# Patient Record
Sex: Female | Born: 1965 | Race: Black or African American | Hispanic: No | Smoking: Never smoker
Health system: Southern US, Community
[De-identification: ages and names within clinical notes are randomized; demographics above are authoritative.]

## PROBLEM LIST (undated history)

## (undated) DIAGNOSIS — F329 Major depressive disorder, single episode, unspecified: Secondary | ICD-10-CM

## (undated) DIAGNOSIS — F32A Depression, unspecified: Secondary | ICD-10-CM

## (undated) DIAGNOSIS — IMO0001 Reserved for inherently not codable concepts without codable children: Secondary | ICD-10-CM

## (undated) DIAGNOSIS — B20 Human immunodeficiency virus [HIV] disease: Secondary | ICD-10-CM

## (undated) DIAGNOSIS — J45909 Unspecified asthma, uncomplicated: Secondary | ICD-10-CM

## (undated) DIAGNOSIS — Z21 Asymptomatic human immunodeficiency virus [HIV] infection status: Secondary | ICD-10-CM

## (undated) DIAGNOSIS — K219 Gastro-esophageal reflux disease without esophagitis: Secondary | ICD-10-CM

## (undated) HISTORY — PX: ABDOMINAL HYSTERECTOMY: SHX81

---

## 2002-11-12 ENCOUNTER — Other Ambulatory Visit: Payer: Self-pay

## 2004-05-31 ENCOUNTER — Emergency Department: Payer: Self-pay | Admitting: Internal Medicine

## 2006-03-29 ENCOUNTER — Emergency Department: Payer: Self-pay | Admitting: Emergency Medicine

## 2006-03-29 ENCOUNTER — Other Ambulatory Visit: Payer: Self-pay

## 2006-05-20 ENCOUNTER — Emergency Department: Payer: Self-pay | Admitting: Emergency Medicine

## 2006-05-20 ENCOUNTER — Other Ambulatory Visit: Payer: Self-pay

## 2007-10-30 ENCOUNTER — Emergency Department: Payer: Self-pay | Admitting: Emergency Medicine

## 2008-03-21 IMAGING — CT CT CHEST W/ CM
1 series · 16 of 34 positions shown, 20 images · IV contrast (APPLIED)
Comparison: none

REASON FOR EXAM: intermittent cp/tachycardic/hi ddimer
COMMENTS:

[Series 5: soft tissue · axial · 0.77mm/px · z∈[-164,+88]mm · 16 of 95 slices shown, 20 images]
[im 7/95  mediastinal]
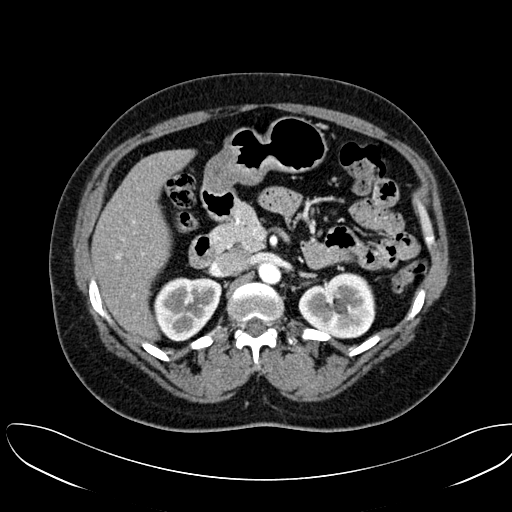
[im 7/95  lung]
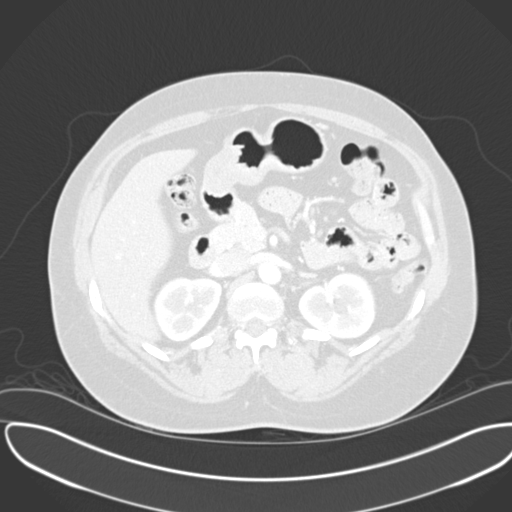
[im 14/95  lung]
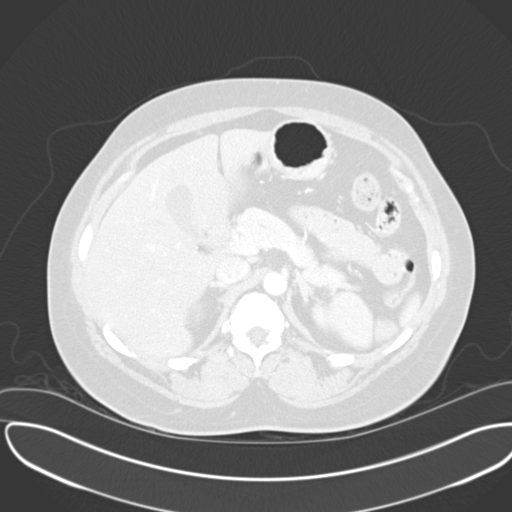
[im 19/95  lung]
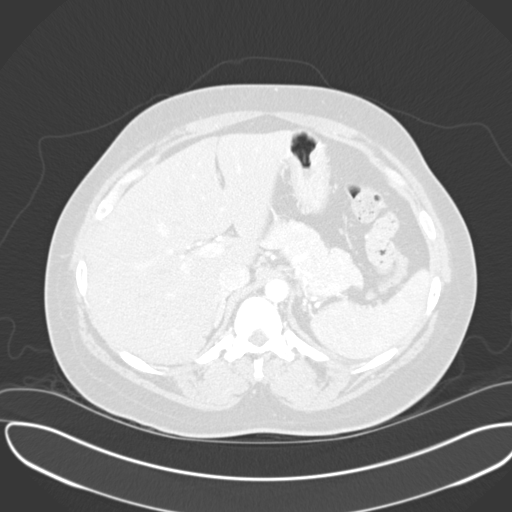
[im 25/95  lung]
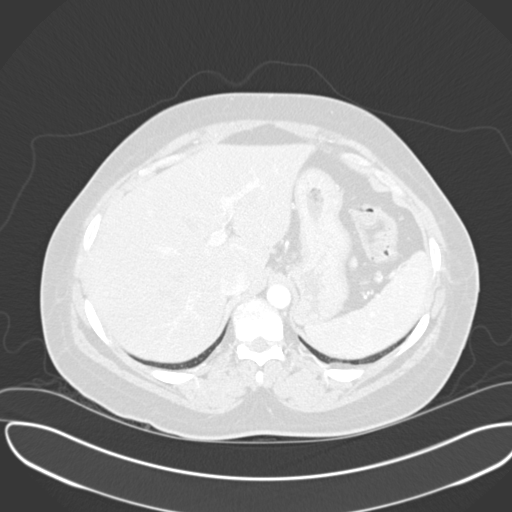
[im 32/95  mediastinal]
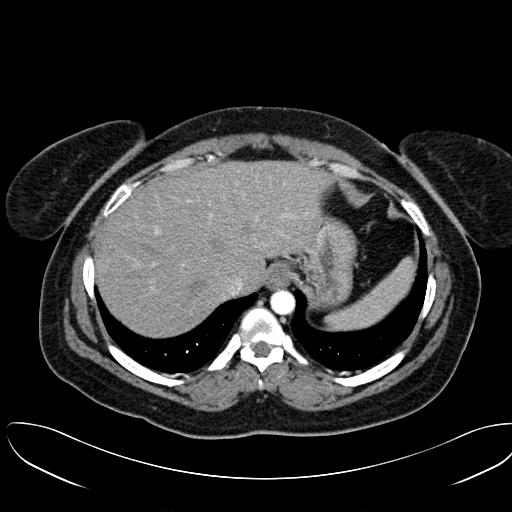
[im 32/95  lung]
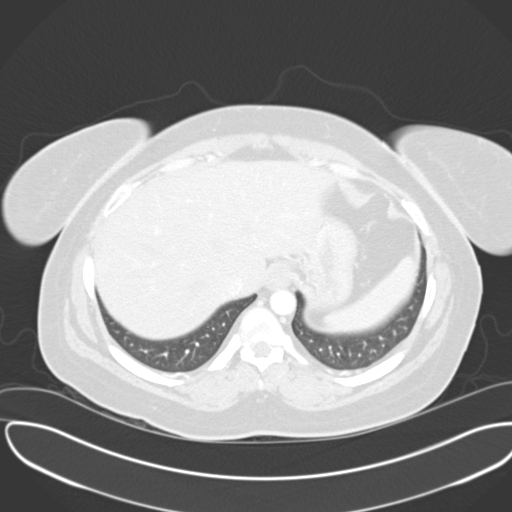
[im 38/95  lung]
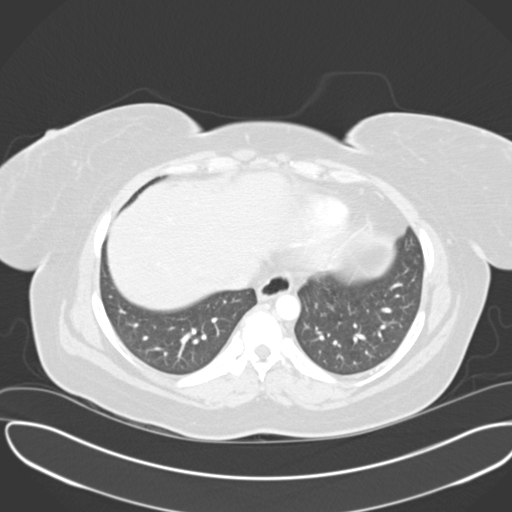
[im 42/95  lung]
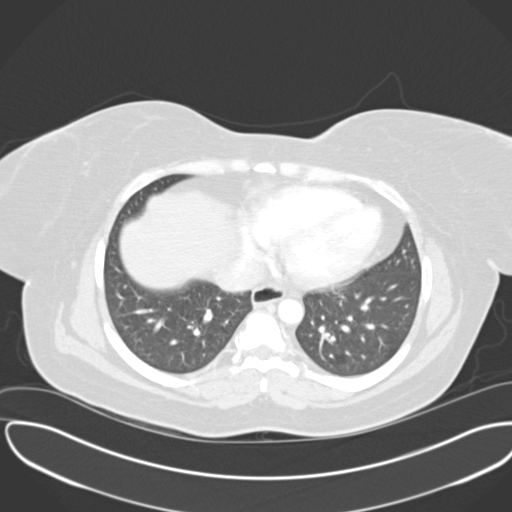
[im 46/95  lung]
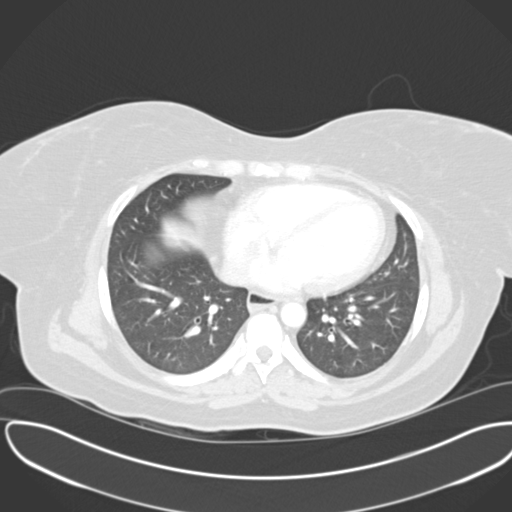
[im 51/95  mediastinal]
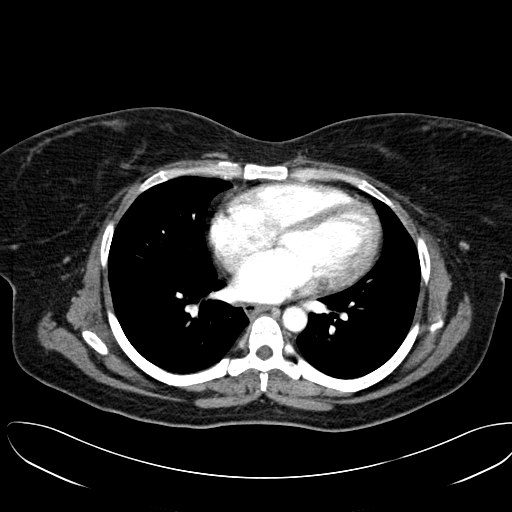
[im 51/95  lung]
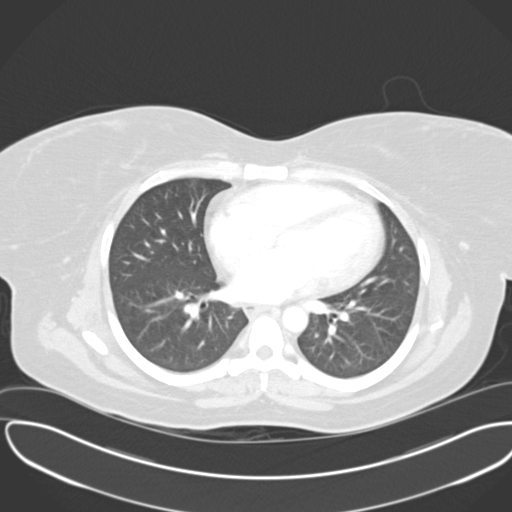
[im 56/95  lung]
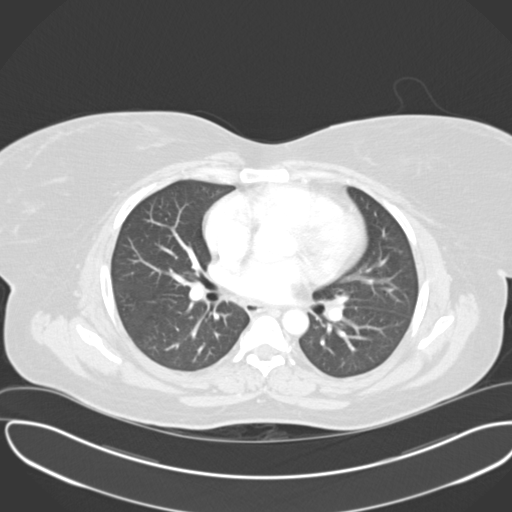
[im 60/95  lung]
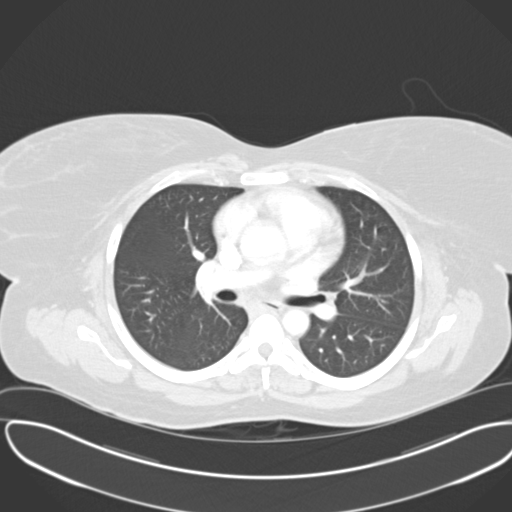
[im 67/95  lung]
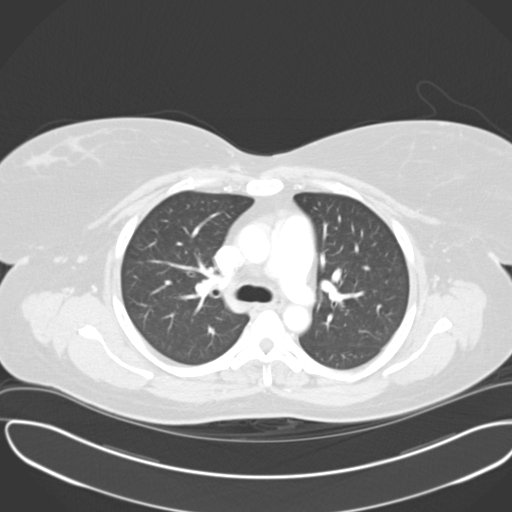
[im 74/95  mediastinal]
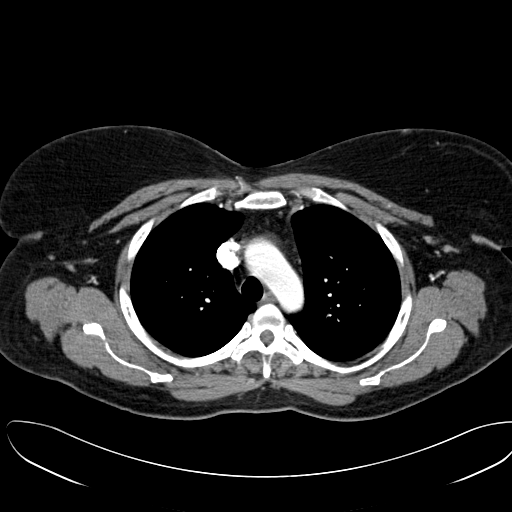
[im 74/95  lung]
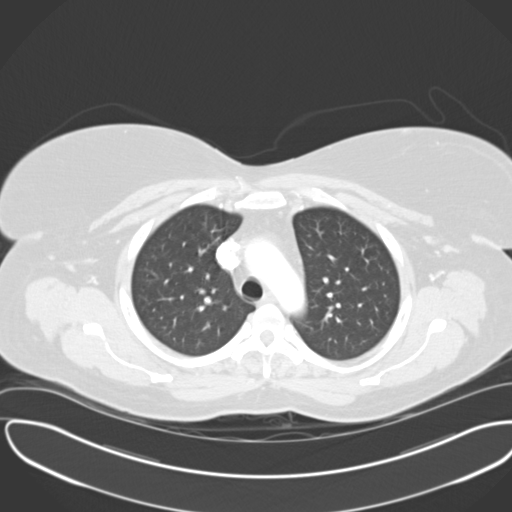
[im 77/95  lung]
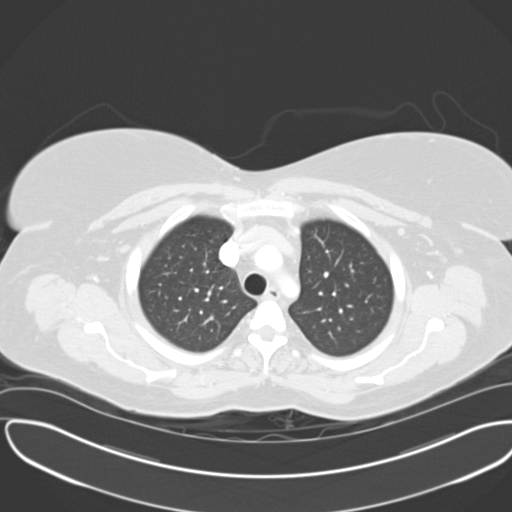
[im 84/95  lung]
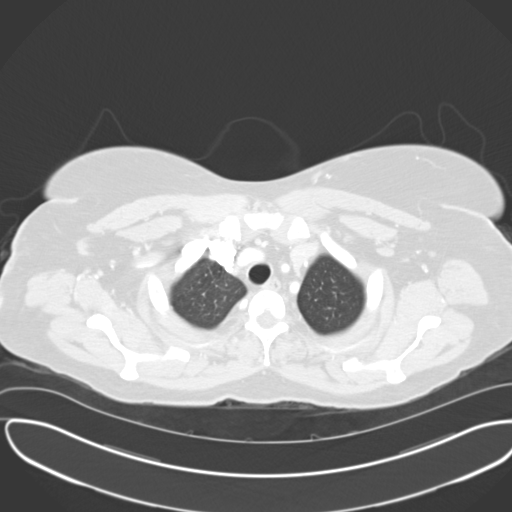
[im 91/95  lung]
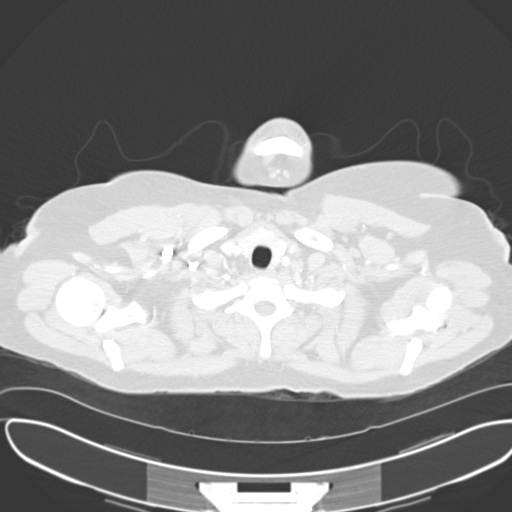

[16 of 34 positions shown; findings below may reference images not displayed]

PROCEDURE:     CT  - CT CHEST WITH CONTRAST  - May 20, 2006  [DATE]

RESULT:     Emergent CT of the chest with contrast demonstrate normal
opacification of the pulmonary arteries. The thoracic aorta is normal. The
upper abdominal viscera included on the study appear normal. The mediastinal
and hilar structures are within normal limits. The lungs are clear. There
does appear to be a small hiatal hernia noted.
IMPRESSION: 1. No pulmonary embolism.
2. Small hiatal hernia.

## 2008-03-21 IMAGING — CR DG CHEST 2V
1 series · 2 of 2 positions shown · non-contrast
Comparison: none

REASON FOR EXAM: sob
COMMENTS:

PROCEDURE:     DXR - DXR CHEST PA (OR AP) AND LATERAL  - May 20, 2006  [DATE]
RESULT:     Comparison is made to a prior exam of 03-29-2006. The lung fields
are clear. The heart, mediastina and osseous structures show no significant
abnormalities.

[Series 1: view not recorded · 0.17mm/px · 2 of 2 slices shown]
[im 1/2]
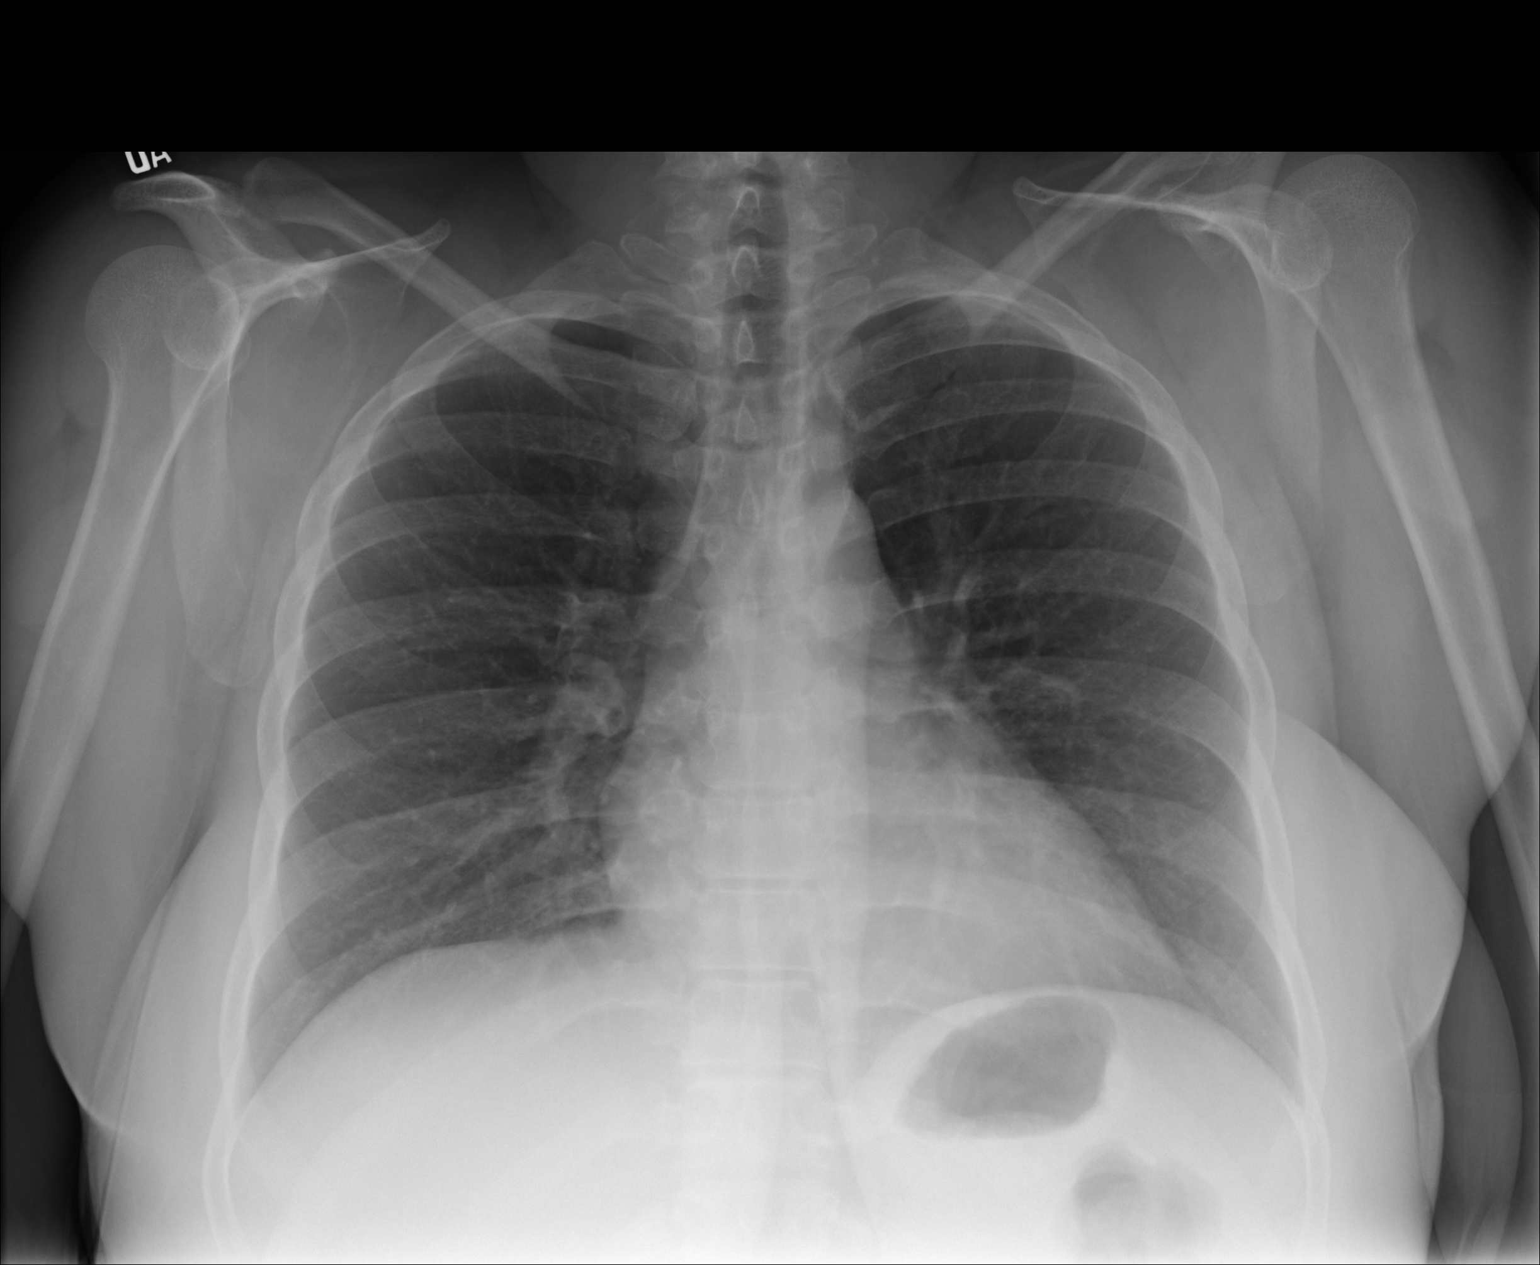
[im 2/2]
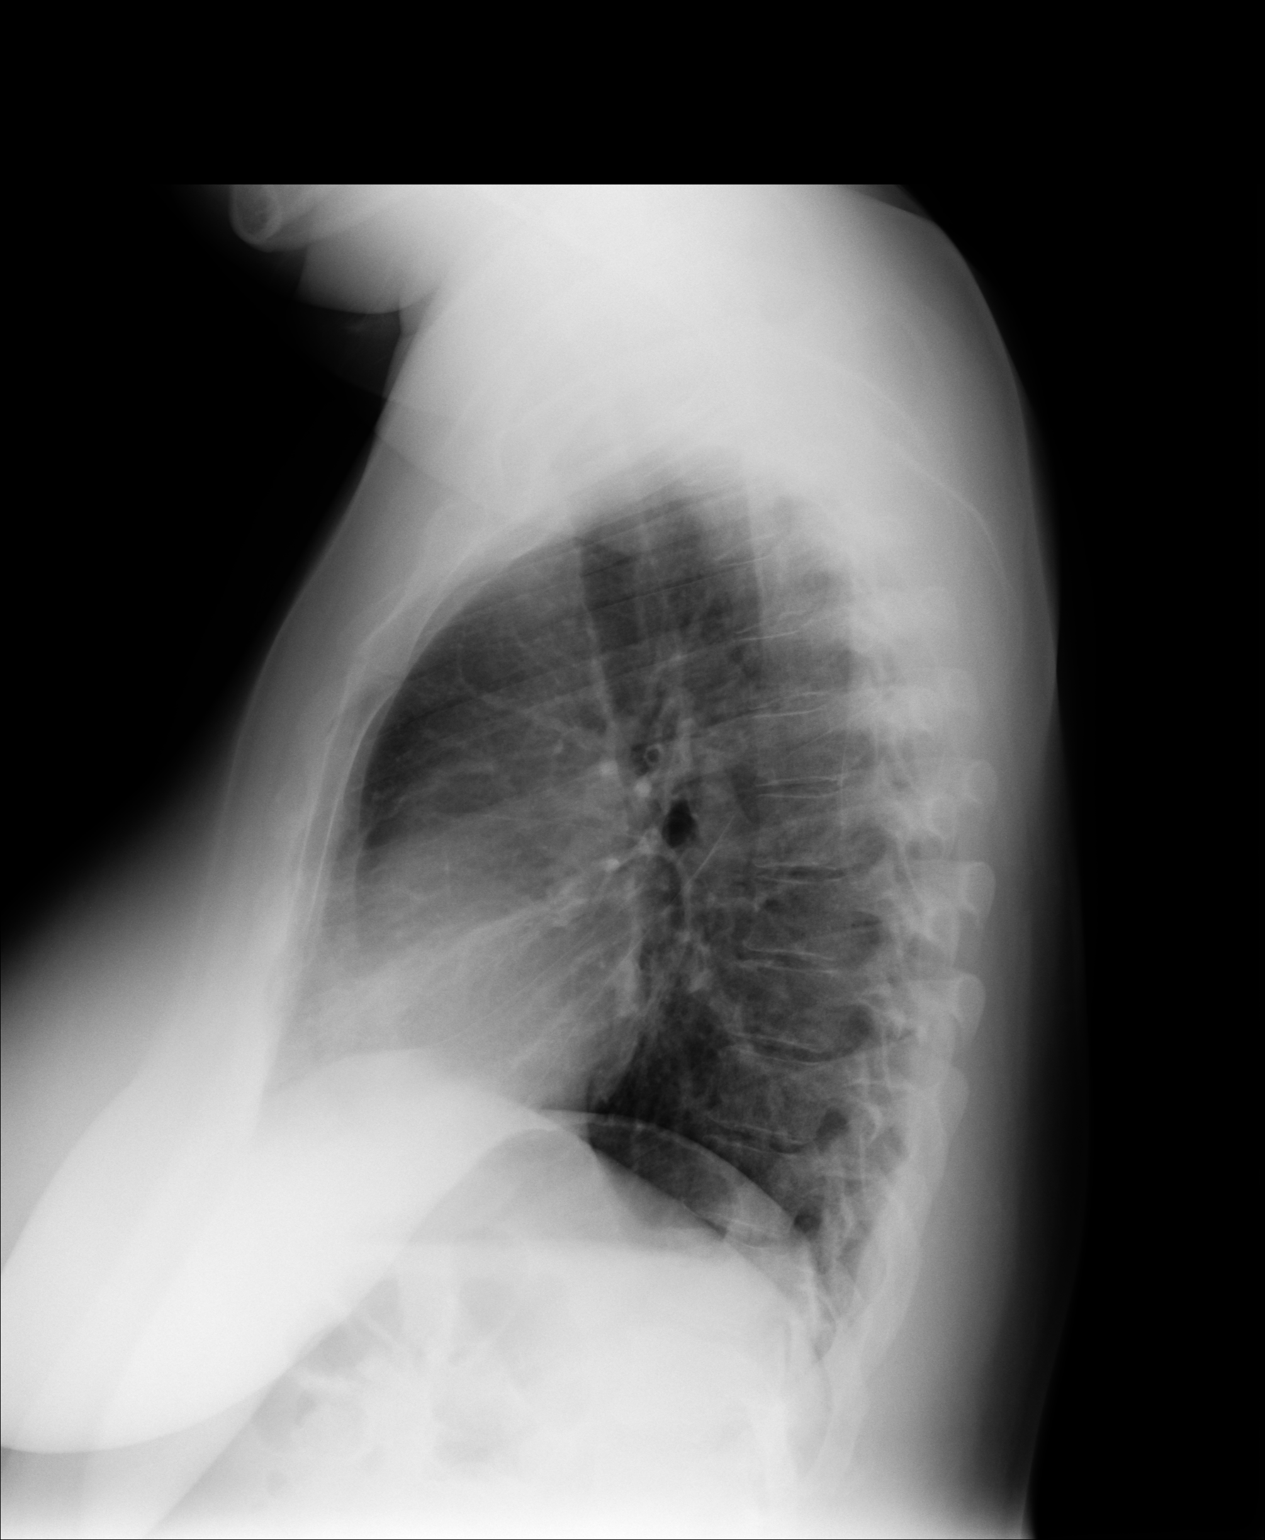

[2 of 2 positions shown; findings below may reference images not displayed]

IMPRESSION: 1.     No acute changes are identified.

## 2010-07-08 ENCOUNTER — Emergency Department: Payer: Self-pay | Admitting: Emergency Medicine

## 2014-08-21 ENCOUNTER — Other Ambulatory Visit: Payer: Self-pay

## 2014-08-21 ENCOUNTER — Emergency Department
Admission: EM | Admit: 2014-08-21 | Discharge: 2014-08-21 | Disposition: A | Discharge status: Home / Self Care | Payer: Medicare Other | Attending: Emergency Medicine | Admitting: Emergency Medicine

## 2014-08-21 ENCOUNTER — Emergency Department: Payer: Medicare Other

## 2014-08-21 DIAGNOSIS — R079 Chest pain, unspecified: Secondary | ICD-10-CM | POA: Diagnosis not present

## 2014-08-21 DIAGNOSIS — Z79899 Other long term (current) drug therapy: Secondary | ICD-10-CM | POA: Insufficient documentation

## 2014-08-21 DIAGNOSIS — Z21 Asymptomatic human immunodeficiency virus [HIV] infection status: Secondary | ICD-10-CM | POA: Insufficient documentation

## 2014-08-21 DIAGNOSIS — R05 Cough: Secondary | ICD-10-CM | POA: Diagnosis present

## 2014-08-21 HISTORY — DX: Asymptomatic human immunodeficiency virus (hiv) infection status: Z21

## 2014-08-21 HISTORY — DX: Human immunodeficiency virus (HIV) disease: B20

## 2014-08-21 LAB — BASIC METABOLIC PANEL
ANION GAP: 10 (ref 5–15)
BUN: 14 mg/dL (ref 6–20)
CHLORIDE: 103 mmol/L (ref 101–111)
CO2: 25 mmol/L (ref 22–32)
Calcium: 9.2 mg/dL (ref 8.9–10.3)
Creatinine, Ser: 1.55 mg/dL — ABNORMAL HIGH (ref 0.44–1.00)
GFR calc Af Amer: 44 mL/min — ABNORMAL LOW (ref >60)
GFR calc non Af Amer: 38 mL/min — ABNORMAL LOW (ref >60)
Glucose, Bld: 107 mg/dL — ABNORMAL HIGH (ref 65–99)
Potassium: 4.1 mmol/L (ref 3.5–5.1)
Sodium: 138 mmol/L (ref 135–145)

## 2014-08-21 LAB — CBC
HEMATOCRIT: 36.9 % (ref 35.0–47.0)
HEMOGLOBIN: 12.5 g/dL (ref 12.0–16.0)
MCH: 36.7 pg — ABNORMAL HIGH (ref 26.0–34.0)
MCHC: 34 g/dL (ref 32.0–36.0)
MCV: 108 fL — ABNORMAL HIGH (ref 80.0–100.0)
Platelets: 323 10*3/uL (ref 150–440)
RBC: 3.42 MIL/uL — ABNORMAL LOW (ref 3.80–5.20)
RDW: 12.5 % (ref 11.5–14.5)
WBC: 9 10*3/uL (ref 3.6–11.0)

## 2014-08-21 LAB — TROPONIN I: Troponin I: <0.03 ng/mL (ref <0.031)

## 2014-08-21 MED ORDER — GI COCKTAIL ~~LOC~~
30.0000 mL | Freq: Once | ORAL | Status: AC
  Administered 2014-08-21: 30 mL via ORAL
  Filled 2014-08-21: qty 30

## 2014-08-21 NOTE — Discharge Instructions (Signed)
No certain cause was found for your 2 weeks of chest discomfort, however your exam and evaluation are reassuring. We discussed the possibility of stress/panic attacks as well as acid reflux/indigestion as possible sources. However given the ongoing chest discomfort, I am referring you to call the cardiologist for an appointment. Call on Monday for an appointment next week. Return to the emergency department for any new or worsening condition including chest pain, nausea, sweating, trouble breathing or shortness of breath, fever, altered mental status, weakness, numbness, or any other symptoms concerning to you.    Chest Pain (Nonspecific) It is often hard to give a specific diagnosis for the cause of chest pain. There is always a chance that your pain could be related to something serious, such as a heart attack or a blood clot in the lungs. You need to follow up with your health care provider for further evaluation. CAUSES   Heartburn.  Pneumonia or bronchitis.  Anxiety or stress.  Inflammation around your heart (pericarditis) or lung (pleuritis or pleurisy).  A blood clot in the lung.  A collapsed lung (pneumothorax). It can develop suddenly on its own (spontaneous pneumothorax) or from trauma to the chest.  Shingles infection (herpes zoster virus). The chest wall is composed of bones, muscles, and cartilage. Any of these can be the source of the pain.  The bones can be bruised by injury.  The muscles or cartilage can be strained by coughing or overwork.  The cartilage can be affected by inflammation and become sore (costochondritis). DIAGNOSIS  Lab tests or other studies may be needed to find the cause of your pain. Your health care provider may have you take a test called an ambulatory electrocardiogram (ECG). An ECG records your heartbeat patterns over a 24-hour period. You may also have other tests, such as:  Transthoracic echocardiogram (TTE). During echocardiography, sound  waves are used to evaluate how blood flows through your heart.  Transesophageal echocardiogram (TEE).  Cardiac monitoring. This allows your health care provider to monitor your heart rate and rhythm in real time.  Holter monitor. This is a portable device that records your heartbeat and can help diagnose heart arrhythmias. It allows your health care provider to track your heart activity for several days, if needed.  Stress tests by exercise or by giving medicine that makes the heart beat faster. TREATMENT   Treatment depends on what may be causing your chest pain. Treatment may include:  Acid blockers for heartburn.  Anti-inflammatory medicine.  Pain medicine for inflammatory conditions.  Antibiotics if an infection is present.  You may be advised to change lifestyle habits. This includes stopping smoking and avoiding alcohol, caffeine, and chocolate.  You may be advised to keep your head raised (elevated) when sleeping. This reduces the chance of acid going backward from your stomach into your esophagus. Most of the time, nonspecific chest pain will improve within 2-3 days with rest and mild pain medicine.  HOME CARE INSTRUCTIONS   If antibiotics were prescribed, take them as directed. Finish them even if you start to feel better.  For the next few days, avoid physical activities that bring on chest pain. Continue physical activities as directed.  Do not use any tobacco products, including cigarettes, chewing tobacco, or electronic cigarettes.  Avoid drinking alcohol.  Only take medicine as directed by your health care provider.  Follow your health care provider's suggestions for further testing if your chest pain does not go away.  Keep any follow-up appointments you made.  If you do not go to an appointment, you could develop lasting (chronic) problems with pain. If there is any problem keeping an appointment, call to reschedule. SEEK MEDICAL CARE IF:   Your chest pain  does not go away, even after treatment.  You have a rash with blisters on your chest.  You have a fever. SEEK IMMEDIATE MEDICAL CARE IF:   You have increased chest pain or pain that spreads to your arm, neck, jaw, back, or abdomen.  You have shortness of breath.  You have an increasing cough, or you cough up blood.  You have severe back or abdominal pain.  You feel nauseous or vomit.  You have severe weakness.  You faint.  You have chills. This is an emergency. Do not wait to see if the pain will go away. Get medical help at once. Call your local emergency services (911 in [place] Do not drive yourself to the hospital. MAKE SURE YOU:   Understand these instructions.  Will watch your condition.  Will get help right away if you are not doing well or get worse. Document Released: 10/05/2004 Document Revised: 12/31/2012 Document Reviewed: 08/01/2007 [facility] Patient Information 2015 [place], [place]. This information is not intended to replace advice given to you by your health care provider. Make sure you discuss any questions you have with your health care provider.

## 2014-08-21 NOTE — ED Provider Notes (Signed)
[facility] Emergency Department Provider Note   ____________________________________________  Time seen: 5 PM I have reviewed the triage vital signs and the triage nursing note.  HISTORY  Chief Complaint Chest Pain and Cough   Historian Patient  HPI Caitlin Mcbride is a 49 y.o. female who has a history of HIV, panic and anxiety, who is here for 2 weeks of what she describes as constant chest tightness. She is describing this as chest pain. It is not pleuritic. Mild shortness of breath but no wheezing. No history of use of inhalers. She states she is referred to see a doctor at [facility] for pulmonary function testing next week. Reports her viral load are undetectable and compliance with HIV medications. No fevers. No weakness or numbness. She does have acid reflux indigestion and takes omeprazole for this.    Past Medical History  Diagnosis Date  . HIV (human immunodeficiency virus infection)     There are no active problems to display for this patient.   Past Surgical History  Procedure Laterality Date  . Abdominal hysterectomy      Current Outpatient Rx  Name  Route  Sig  Dispense  Refill  . citalopram (CELEXA) 10 MG tablet   Oral   Take 10 mg by mouth daily.         . darunavir (PREZISTA) 600 MG tablet   Oral   Take 600 mg by mouth 2 (two) times daily.         Marland Kitchen emtricitabine-tenofovir AF (DESCOVY) 200-25 MG per tablet   Oral   Take 1 tablet by mouth daily.         Marland Kitchen gabapentin (NEURONTIN) 300 MG capsule   Oral   Take 300 mg by mouth at bedtime.         Marland Kitchen LORazepam (ATIVAN) 1 MG tablet   Oral   Take 1 mg by mouth every 8 (eight) hours as needed for anxiety.         Marland Kitchen omeprazole (PRILOSEC) 20 MG capsule   Oral   Take 20 mg by mouth daily.         . raltegravir (ISENTRESS) 400 MG tablet   Oral   Take 400 mg by mouth 2 (two) times daily.         . risperiDONE (RISPERDAL) 0.5 MG tablet   Oral   Take 0.5 mg by mouth 2  (two) times daily.         . ritonavir (NORVIR) 100 MG TABS tablet   Oral   Take 100 mg by mouth 2 (two) times daily.         . traZODone (DESYREL) 100 MG tablet   Oral   Take 100 mg by mouth at bedtime.         . zidovudine (RETROVIR) 300 MG tablet   Oral   Take 300 mg by mouth 2 (two) times daily.           Allergies Review of patient's allergies indicates no known allergies.  No family history on file.  Social History Social History  Substance Use Topics  . Smoking status: Never Smoker   . Smokeless tobacco: Never Used  . Alcohol Use: No    Review of Systems  Constitutional: Negative for fever. Eyes: Negative for visual changes. ENT: Negative for sore throat. Cardiovascular: Negative for palpitations Respiratory: mild cough. Gastrointestinal: Negative for abdominal pain, vomiting and diarrhea. Genitourinary: Negative for dysuria. Musculoskeletal: Negative for back pain. Skin: Negative for  rash. Neurological: Negative for headaches, focal weakness or numbness. 10 point Review of Systems otherwise negative ____________________________________________   PHYSICAL EXAM:  VITAL SIGNS: ED Triage Vitals  Enc Vitals Group     BP 08/21/14 1304 144/90 mmHg     Pulse Rate 08/21/14 1304 101     Resp 08/21/14 1304 18     Temp 08/21/14 1304 98 F (36.7 C)     Temp Source 08/21/14 1304 Oral     SpO2 08/21/14 1304 97 %     Weight 08/21/14 1300 198 lb (89.812 kg)     Height 08/21/14 1300  (1.6 m)     Head Cir --      Peak Flow --      Pain Score 08/21/14 1301 7     Pain Loc --      Pain Edu? --      Excl. in GC? --      Constitutional: Alert and oriented. Well appearing and in no distress. Eyes: Conjunctivae are normal. PERRL. Normal extraocular movements. ENT   Head: Normocephalic and atraumatic.   Nose: No congestion/rhinnorhea.   Mouth/Throat: Mucous membranes are moist.   Neck: No stridor. Cardiovascular/Chest: Normal rate,  regular rhythm.  No murmurs, rubs, or gallops. Respiratory: Normal respiratory effort without tachypnea nor retractions. Breath sounds are clear and equal bilaterally. No wheezes/rales/rhonchi. Gastrointestinal: Soft. No distention, no guarding, no rebound. Nontender . Obese  Genitourinary/rectal:Deferred Musculoskeletal: Nontender with normal range of motion in all extremities. No joint effusions.  No lower extremity tenderness nor edema. Neurologic:  Normal speech and language. No gross or focal neurologic deficits are appreciated. Skin:  Skin is warm, dry and intact. No rash noted. Psychiatric: Mood and affect are normal. Speech and behavior are normal. Patient exhibits appropriate insight and judgment.  ____________________________________________   EKG I, Governor Rooks, MD, the attending physician have personally viewed and interpreted all ECGs.  101 bpm. Sinus tachycardia. Narrow QRS. Normal axis. Nonspecific T wave. ____________________________________________  LABS (pertinent positives/negatives)  Basic metabolic panel without significant abnormality CBC shows white blood cell count 9.0, hemoglobin 12.5 and platelets 323 Troponin less than 0.03  ____________________________________________  RADIOLOGY All Xrays were viewed by me. Imaging interpreted by Radiologist.  Chest x-ray two-view: No active cardio pulmonary disease __________________________________________  PROCEDURES  Procedure(s) performed: None Critical Care performed: None  ____________________________________________   ED COURSE / ASSESSMENT AND PLAN  CONSULTATIONS: None  Pertinent labs & imaging results that were available during my care of the patient were reviewed by me and considered in my medical decision making (see chart for details).   Patient is overall well-appearing and her vital signs and laboratory evaluation are reassuring for no emergency medical condition. I'm not exactly sure what  what her ongoing chest tightness and discomfort are coming from, however she thinks it might be coming from her stress and anxiety. She does see a therapist and she also sees Dr. Janeece Riggers in [place]. She's got call and make an appointment for follow-up to discuss management of her anxiety. Patient does not have any acute psychiatric need for emergencypsychiatric evaluation.some of her symptoms, they may be related to acid reflux indigestion, she is already on omeprazole. We discussed dietary changes to help with indigestion.  Chest x-ray clear, lungs clear without wheezing, and O2 sat normal. No pleuritic chest pain. I do not suspect a PE. No evidence of pneumonia. I think ACS is unlikely given her age and the description of the pain, with a reassuring EKG and  negative troponin. I will go ahead and refer her to follow-up with a cardiologist as well.  In terms of her HIV, she is reporting compliance and undetectable viral load, so I'm not worried about any additional risk factors for this patient for PCP pneumonia or some other HIV-related disease.  Patient / Family / Caregiver informed of clinical course, medical decision-making process, and agree with plan.   I discussed return precautions, follow-up instructions, and discharged instructions with patient and/or family.  ___________________________________________   FINAL CLINICAL IMPRESSION(S) / ED DIAGNOSES   Final diagnoses:  Chest pain, unspecified chest pain type    FOLLOW UP  Referred ZO:XWRUEAV care physician, psychiatrist, and cardiologist-Dr. Lady Gary next week   Governor Rooks, MD 08/21/14 1729

## 2014-08-21 NOTE — ED Notes (Signed)
Pt c/o chest tightness with a cough for the past week.  Denies SOB or other sx.Marland Kitchen

## 2014-08-24 ENCOUNTER — Encounter: Payer: Self-pay | Admitting: Emergency Medicine

## 2014-08-24 ENCOUNTER — Emergency Department: Payer: Medicare Other

## 2014-08-24 ENCOUNTER — Emergency Department
Admission: EM | Admit: 2014-08-24 | Discharge: 2014-08-24 | Disposition: A | Discharge status: Home / Self Care | Payer: Medicare Other | Attending: Emergency Medicine | Admitting: Emergency Medicine

## 2014-08-24 ENCOUNTER — Other Ambulatory Visit: Payer: Self-pay

## 2014-08-24 DIAGNOSIS — IMO0001 Reserved for inherently not codable concepts without codable children: Secondary | ICD-10-CM

## 2014-08-24 DIAGNOSIS — Z79899 Other long term (current) drug therapy: Secondary | ICD-10-CM | POA: Diagnosis not present

## 2014-08-24 DIAGNOSIS — R079 Chest pain, unspecified: Secondary | ICD-10-CM | POA: Diagnosis present

## 2014-08-24 DIAGNOSIS — K219 Gastro-esophageal reflux disease without esophagitis: Secondary | ICD-10-CM | POA: Diagnosis not present

## 2014-08-24 HISTORY — DX: Major depressive disorder, single episode, unspecified: F32.9

## 2014-08-24 HISTORY — DX: Reserved for inherently not codable concepts without codable children: IMO0001

## 2014-08-24 HISTORY — DX: Gastro-esophageal reflux disease without esophagitis: K21.9

## 2014-08-24 HISTORY — DX: Depression, unspecified: F32.A

## 2014-08-24 LAB — BASIC METABOLIC PANEL
Anion gap: 7 (ref 5–15)
BUN: 17 mg/dL (ref 6–20)
CHLORIDE: 102 mmol/L (ref 101–111)
CO2: 27 mmol/L (ref 22–32)
Calcium: 9 mg/dL (ref 8.9–10.3)
Creatinine, Ser: 1.38 mg/dL — ABNORMAL HIGH (ref 0.44–1.00)
GFR calc non Af Amer: 44 mL/min — ABNORMAL LOW (ref >60)
GFR, EST AFRICAN AMERICAN: 51 mL/min — AB (ref >60)
Glucose, Bld: 92 mg/dL (ref 65–99)
Potassium: 4.2 mmol/L (ref 3.5–5.1)
Sodium: 136 mmol/L (ref 135–145)

## 2014-08-24 LAB — CBC
HCT: 37.5 % (ref 35.0–47.0)
HEMOGLOBIN: 12.4 g/dL (ref 12.0–16.0)
MCH: 36.1 pg — AB (ref 26.0–34.0)
MCHC: 32.9 g/dL (ref 32.0–36.0)
MCV: 109.7 fL — AB (ref 80.0–100.0)
Platelets: 307 10*3/uL (ref 150–440)
RBC: 3.42 MIL/uL — ABNORMAL LOW (ref 3.80–5.20)
RDW: 12.9 % (ref 11.5–14.5)
WBC: 7.8 10*3/uL (ref 3.6–11.0)

## 2014-08-24 LAB — TROPONIN I: Troponin I: <0.03 ng/mL (ref <0.031)

## 2014-08-24 MED ORDER — GI COCKTAIL ~~LOC~~
ORAL | Status: AC
  Filled 2014-08-24: qty 30

## 2014-08-24 MED ORDER — ALUM & MAG HYDROXIDE-SIMETH 400-400-40 MG/5ML PO SUSP: 5.0000 mL | Freq: Four times a day (QID) | ORAL | Status: AC | PRN

## 2014-08-24 MED ORDER — GI COCKTAIL ~~LOC~~
30.0000 mL | Freq: Once | ORAL | Status: AC
  Administered 2014-08-24: 30 mL via ORAL

## 2014-08-24 NOTE — ED Notes (Signed)
Pt with 2 weeks of centralized chest pain, reports she was checked out for the same and sent home with rx for maalox. Pt in no apparent distress.

## 2014-08-24 NOTE — ED Notes (Signed)
Pt told this RN that she was seen here last week for the same type of pain, was given Maalox with relief, and was told to stay away from "red foods." Pt states she ate a red hot dog for lunch and began feeling the same pain as last week. Was taken back for an EKG within .

## 2014-08-24 NOTE — Discharge Instructions (Signed)
Chest Pain (Nonspecific) °It is often hard to give a specific diagnosis for the cause of chest pain. There is always a chance that your pain could be related to something serious, such as a heart attack or a blood clot in the lungs. You need to follow up with your health care provider for further evaluation. °CAUSES  °· Heartburn. °· Pneumonia or bronchitis. °· Anxiety or stress. °· Inflammation around your heart (pericarditis) or lung (pleuritis or pleurisy). °· A blood clot in the lung. °· A collapsed lung (pneumothorax). It can develop suddenly on its own (spontaneous pneumothorax) or from trauma to the chest. °· Shingles infection (herpes zoster virus). °The chest wall is composed of bones, muscles, and cartilage. Any of these can be the source of the pain. °· The bones can be bruised by injury. °· The muscles or cartilage can be strained by coughing or overwork. °· The cartilage can be affected by inflammation and become sore (costochondritis). °DIAGNOSIS  °Lab tests or other studies may be needed to find the cause of your pain. Your health care provider may have you take a test called an ambulatory electrocardiogram (ECG). An ECG records your heartbeat patterns over a 24-hour period. You may also have other tests, such as: °· Transthoracic echocardiogram (TTE). During echocardiography, sound waves are used to evaluate how blood flows through your heart. °· Transesophageal echocardiogram (TEE). °· Cardiac monitoring. This allows your health care provider to monitor your heart rate and rhythm in real time. °· Holter monitor. This is a portable device that records your heartbeat and can help diagnose heart arrhythmias. It allows your health care provider to track your heart activity for several days, if needed. °· Stress tests by exercise or by giving medicine that makes the heart beat faster. °TREATMENT  °· Treatment depends on what may be causing your chest pain. Treatment may include: °· Acid blockers for  heartburn. °· Anti-inflammatory medicine. °· Pain medicine for inflammatory conditions. °· Antibiotics if an infection is present. °· You may be advised to change lifestyle habits. This includes stopping smoking and avoiding alcohol, caffeine, and chocolate. °· You may be advised to keep your head raised (elevated) when sleeping. This reduces the chance of acid going backward from your stomach into your esophagus. °Most of the time, nonspecific chest pain will improve within 2-3 days with rest and mild pain medicine.  °HOME CARE INSTRUCTIONS  °· If antibiotics were prescribed, take them as directed. Finish them even if you start to feel better. °· For the next few days, avoid physical activities that bring on chest pain. Continue physical activities as directed. °· Do not use any tobacco products, including cigarettes, chewing tobacco, or electronic cigarettes. °· Avoid drinking alcohol. °· Only take medicine as directed by your health care provider. °· Follow your health care provider's suggestions for further testing if your chest pain does not go away. °· Keep any follow-up appointments you made. If you do not go to an appointment, you could develop lasting (chronic) problems with pain. If there is any problem keeping an appointment, call to reschedule. °SEEK MEDICAL CARE IF:  °· Your chest pain does not go away, even after treatment. °· You have a rash with blisters on your chest. °· You have a fever. °SEEK IMMEDIATE MEDICAL CARE IF:  °· You have increased chest pain or pain that spreads to your arm, neck, jaw, back, or abdomen. °· You have shortness of breath. °· You have an increasing cough, or you cough   up blood. °· You have severe back or abdominal pain. °· You feel nauseous or vomit. °· You have severe weakness. °· You faint. °· You have chills. °This is an emergency. Do not wait to see if the pain will go away. Get medical help at once. Call your local emergency services (911 in [place] Do not drive  yourself to the hospital. °MAKE SURE YOU:  °· Understand these instructions. °· Will watch your condition. °· Will get help right away if you are not doing well or get worse. °Document Released: 10/05/2004 Document Revised: 12/31/2012 Document Reviewed: 08/01/2007 °[employer]® Patient Information ©2015 [employer]. This information is not intended to replace advice given to you by your health care provider. Make sure you discuss any questions you have with your health care provider. °Gastroesophageal Reflux Disease, Adult °Gastroesophageal reflux disease (GERD) happens when acid from your stomach flows up into the esophagus. When acid comes in contact with the esophagus, the acid causes soreness (inflammation) in the esophagus. Over time, GERD may create small holes (ulcers) in the lining of the esophagus. °CAUSES  °· Increased body weight. This puts pressure on the stomach, making acid rise from the stomach into the esophagus. °· Smoking. This increases acid production in the stomach. °· Drinking alcohol. This causes decreased pressure in the lower esophageal sphincter (valve or ring of muscle between the esophagus and stomach), allowing acid from the stomach into the esophagus. °· Late evening meals and a full stomach. This increases pressure and acid production in the stomach. °· A malformed lower esophageal sphincter. °Sometimes, no cause is found. °SYMPTOMS  °· Burning pain in the lower part of the mid-chest behind the breastbone and in the mid-stomach area. This may occur twice a week or more often. °· Trouble swallowing. °· Sore throat. °· Dry cough. °· Asthma-like symptoms including chest tightness, shortness of breath, or wheezing. °DIAGNOSIS  °Your caregiver may be able to diagnose GERD based on your symptoms. In some cases, X-rays and other tests may be done to check for complications or to check the condition of your stomach and esophagus. °TREATMENT  °Your caregiver may recommend over-the-counter or  prescription medicines to help decrease acid production. Ask your caregiver before starting or adding any new medicines.  °HOME CARE INSTRUCTIONS  °· Change the factors that you can control. Ask your caregiver for guidance concerning weight loss, quitting smoking, and alcohol consumption. °· Avoid foods and drinks that make your symptoms worse, such as: °¨ Caffeine or alcoholic drinks. °¨ Chocolate. °¨ Peppermint or mint flavorings. °¨ Garlic and onions. °¨ Spicy foods. °¨ Citrus fruits, such as oranges, lemons, or limes. °¨ Tomato-based foods such as sauce, chili, salsa, and pizza. °¨ Fried and fatty foods. °· Avoid lying down for the 3 hours prior to your bedtime or prior to taking a nap. °· Eat small, frequent meals instead of large meals. °· Wear loose-fitting clothing. Do not wear anything tight around your waist that causes pressure on your stomach. °· Raise the head of your bed 6 to 8 inches with wood blocks to help you sleep. Extra pillows will not help. °· Only take over-the-counter or prescription medicines for pain, discomfort, or fever as directed by your caregiver. °· Do not take aspirin, ibuprofen, or other nonsteroidal anti-inflammatory drugs (NSAIDs). °SEEK IMMEDIATE MEDICAL CARE IF:  °· You have pain in your arms, neck, jaw, teeth, or back. °· Your pain increases or changes in intensity or duration. °· You develop nausea, vomiting, or sweating (diaphoresis). °·   You develop shortness of breath, or you faint. °· Your vomit is green, yellow, black, or looks like coffee grounds or blood. °· Your stool is red, bloody, or black. °These symptoms could be signs of other problems, such as heart disease, gastric bleeding, or esophageal bleeding. °MAKE SURE YOU:  °· Understand these instructions. °· Will watch your condition. °· Will get help right away if you are not doing well or get worse. °Document Released: 10/05/2004 Document Revised: 03/20/2011 Document Reviewed: 07/15/2010 °[employer]® Patient  Information ©2015 [employer]. This information is not intended to replace advice given to you by your health care provider. Make sure you discuss any questions you have with your health care provider. ° °

## 2014-08-24 NOTE — ED Provider Notes (Signed)
[facility] Emergency Department Provider Note  Time seen: 3:37 PM  I have reviewed the triage vital signs and the nursing notes.   HISTORY  Chief Complaint Chest Pain    HPI Caitlin Mcbride is a 49 y.o. female presents the emergency department with intermittent chest pains for the past several days. According to the patient she has been having this pain intermittently for the past month or so. Was seen here in the emergency department and diagnosed with likely reflux. She states the Maalox seemed to help a lot, but the pain came back today so she came to the emergency department for evaluation. States she is out of Maalox and cannot afford it. She took her prescribed omeprazole, but it did not help. Describes the chest pain as a burning type pain, moderate in severity. Does radiate up to her throat. Denies any nausea, vomiting, diarrhea or shortness of breath.     Past Medical History  Diagnosis Date  . HIV (human immunodeficiency virus infection)   . Depression   . Reflux     There are no active problems to display for this patient.   Past Surgical History  Procedure Laterality Date  . Abdominal hysterectomy      Current Outpatient Rx  Name  Route  Sig  Dispense  Refill  . citalopram (CELEXA) 10 MG tablet   Oral   Take 10 mg by mouth daily.         . darunavir (PREZISTA) 600 MG tablet   Oral   Take 600 mg by mouth 2 (two) times daily.         Marland Kitchen emtricitabine-tenofovir AF (DESCOVY) 200-25 MG per tablet   Oral   Take 1 tablet by mouth daily.         Marland Kitchen gabapentin (NEURONTIN) 300 MG capsule   Oral   Take 300 mg by mouth at bedtime.         Marland Kitchen LORazepam (ATIVAN) 1 MG tablet   Oral   Take 1 mg by mouth every 8 (eight) hours as needed for anxiety.         Marland Kitchen omeprazole (PRILOSEC) 20 MG capsule   Oral   Take 20 mg by mouth daily.         . raltegravir (ISENTRESS) 400 MG tablet   Oral   Take 400 mg by mouth 2 (two) times  daily.         . risperiDONE (RISPERDAL) 0.5 MG tablet   Oral   Take 0.5 mg by mouth 2 (two) times daily.         . ritonavir (NORVIR) 100 MG TABS tablet   Oral   Take 100 mg by mouth 2 (two) times daily.         . traZODone (DESYREL) 100 MG tablet   Oral   Take 100 mg by mouth at bedtime.         . zidovudine (RETROVIR) 300 MG tablet   Oral   Take 300 mg by mouth 2 (two) times daily.           Allergies Review of patient's allergies indicates no known allergies.  No family history on file.  Social History Social History  Substance Use Topics  . Smoking status: Never Smoker   . Smokeless tobacco: Never Used  . Alcohol Use: No    Review of Systems Constitutional: Negative for fever. Cardiovascular: Positive for chest burning intermittently. Respiratory: Negative for shortness of breath. Gastrointestinal: Negative  for abdominal pain, vomiting and diarrhea. Neurological: Negative for headache  10-point ROS otherwise negative.  ____________________________________________   PHYSICAL EXAM:  VITAL SIGNS: ED Triage Vitals  Enc Vitals Group     BP 08/24/14 1406 127/92 mmHg     Pulse Rate 08/24/14 1406 93     Resp 08/24/14 1406 16     Temp 08/24/14 1406 97.9 F (36.6 C)     Temp Source 08/24/14 1406 Oral     SpO2 08/24/14 1406 96 %     Weight 08/24/14 1406 198 lb (89.812 kg)     Height 08/24/14 1406 5\' 3"  (1.6 m)     Head Cir --      Peak Flow --      Pain Score 08/24/14 1408 6     Pain Loc --      Pain Edu? --      Excl. in GC? --     Constitutional: Alert and oriented. Well appearing and in no distress. Eyes: Normal exam ENT   Mouth/Throat: Mucous membranes are moist. Cardiovascular: Normal rate, regular rhythm. No murmurs, rubs, or gallops. Respiratory: Normal respiratory effort without tachypnea nor retractions. Breath sounds are clear and equal bilaterally. No wheezes/rales/rhonchi. Gastrointestinal: Soft and nontender. No distention.    Musculoskeletal: Nontender with normal range of motion in all extremities.  Neurologic:  Normal speech and language. No gross focal neurologic deficits Skin:  Skin is warm, dry and intact.  Psychiatric: Mood and affect are normal. Speech and behavior are normal.   ____________________________________________    EKG  EKG reviewed and interpreted by myself shows normal sinus rhythm at 90 bpm, narrow QRS, normal axis, normal intervals, nonspecific but no concerning ST changes are noted.  ____________________________________________    RADIOLOGY  No acute findings on chest x-ray  ____________________________________________   INITIAL IMPRESSION / ASSESSMENT AND PLAN / ED COURSE  Pertinent labs & imaging results that were available during my care of the patient were reviewed by me and considered in my medical decision making (see chart for details).  EKG, labs, chest x-ray within normal limits. Discussed with patient and the need to take omeprazole every day for affect. Also stated the need to take Maalox for acute symptoms. Patient agrees and will obtain some over-the-counter. We will treat with a GI cocktail in the emergency department and monitor for symptom relief. The patient has discussed with [facility] for obtaining a stress test, which she states she scheduled this morning.  ____________________________________________   FINAL CLINICAL IMPRESSION(S) / ED DIAGNOSES  Chest pain Reflux   Minna Antis, MD 08/24/14 1541

## 2014-12-07 ENCOUNTER — Encounter: Payer: Self-pay | Admitting: Medical Oncology

## 2014-12-07 ENCOUNTER — Emergency Department
Admission: EM | Admit: 2014-12-07 | Discharge: 2014-12-07 | Disposition: A | Discharge status: Home / Self Care | Payer: Medicare Other | Attending: Emergency Medicine | Admitting: Emergency Medicine

## 2014-12-07 DIAGNOSIS — J069 Acute upper respiratory infection, unspecified: Secondary | ICD-10-CM

## 2014-12-07 DIAGNOSIS — J209 Acute bronchitis, unspecified: Secondary | ICD-10-CM | POA: Insufficient documentation

## 2014-12-07 DIAGNOSIS — Z79899 Other long term (current) drug therapy: Secondary | ICD-10-CM | POA: Insufficient documentation

## 2014-12-07 DIAGNOSIS — R05 Cough: Secondary | ICD-10-CM | POA: Diagnosis present

## 2014-12-07 MED ORDER — AMOXICILLIN-POT CLAVULANATE 875-125 MG PO TABS: 1.0000 | ORAL_TABLET | Freq: Two times a day (BID) | ORAL | Status: AC

## 2014-12-07 MED ORDER — GUAIFENESIN-CODEINE 100-10 MG/5ML PO SOLN: 10.0000 mL | ORAL | Status: AC | PRN

## 2014-12-07 NOTE — Discharge Instructions (Signed)
Acute Bronchitis  Bronchitis is inflammation of the airways that extend from the windpipe into the lungs (bronchi). The inflammation often causes mucus to develop. This leads to a cough, which is the most common symptom of bronchitis.   In acute bronchitis, the condition usually develops suddenly and goes away over time, usually in a couple weeks. Smoking, allergies, and asthma can make bronchitis worse. Repeated episodes of bronchitis may cause further lung problems.   CAUSES  Acute bronchitis is most often caused by the same virus that causes a cold. The virus can spread from person to person (contagious) through coughing, sneezing, and touching contaminated objects.  SIGNS AND SYMPTOMS   · Cough.    · Fever.    · Coughing up mucus.    · Body aches.    · Chest congestion.    · Chills.    · Shortness of breath.    · Sore throat.    DIAGNOSIS   Acute bronchitis is usually diagnosed through a physical exam. Your health care provider will also ask you questions about your medical history. Tests, such as chest X-rays, are sometimes done to rule out other conditions.   TREATMENT   Acute bronchitis usually goes away in a couple weeks. Oftentimes, no medical treatment is necessary. Medicines are sometimes given for relief of fever or cough. Antibiotic medicines are usually not needed but may be prescribed in certain situations. In some cases, an inhaler may be recommended to help reduce shortness of breath and control the cough. A cool mist vaporizer may also be used to help thin bronchial secretions and make it easier to clear the chest.   HOME CARE INSTRUCTIONS  · Get plenty of rest.    · Drink enough fluids to keep your urine clear or pale yellow (unless you have a medical condition that requires fluid restriction). Increasing fluids may help thin your respiratory secretions (sputum) and reduce chest congestion, and it will prevent dehydration.    · Take medicines only as directed by your health care provider.  · If  you were prescribed an antibiotic medicine, finish it all even if you start to feel better.  · Avoid smoking and secondhand smoke. Exposure to cigarette smoke or irritating chemicals will make bronchitis worse. If you are a smoker, consider using nicotine gum or skin patches to help control withdrawal symptoms. Quitting smoking will help your lungs heal faster.    · Reduce the chances of another bout of acute bronchitis by washing your hands frequently, avoiding people with cold symptoms, and trying not to touch your hands to your mouth, nose, or eyes.    · Keep all follow-up visits as directed by your health care provider.    SEEK MEDICAL CARE IF:  Your symptoms do not improve after 1 week of treatment.   SEEK IMMEDIATE MEDICAL CARE IF:  · You develop an increased fever or chills.    · You have chest pain.    · You have severe shortness of breath.  · You have bloody sputum.    · You develop dehydration.  · You faint or repeatedly feel like you are going to pass out.  · You develop repeated vomiting.  · You develop a severe headache.  MAKE SURE YOU:   · Understand these instructions.  · Will watch your condition.  · Will get help right away if you are not doing well or get worse.     This information is not intended to replace advice given to you by your health care provider. Make sure you discuss any questions you have   with your health care provider.     Document Released: 02/03/2004 Document Revised: 01/16/2014 Document Reviewed: 06/18/2012  Elsevier Interactive Patient Education ©2016 [employer]      Upper Respiratory Infection, Adult  Most upper respiratory infections (URIs) are caused by a virus. A URI affects the nose, throat, and upper air passages. The most common type of URI is often called "the common cold."  HOME CARE   · Take medicines only as told by your doctor.  · Gargle warm saltwater or take cough drops to comfort your throat as told by your doctor.  · Use a warm mist humidifier or inhale steam  from a shower to increase air moisture. This may make it easier to breathe.  · Drink enough fluid to keep your pee (urine) clear or pale yellow.  · Eat soups and other clear broths.  · Have a healthy diet.  · Rest as needed.  · Go back to work when your fever is gone or your doctor says it is okay.  ¨ You may need to stay home longer to avoid giving your URI to others.  ¨ You can also wear a face mask and wash your hands often to prevent spread of the virus.  · Use your inhaler more if you have asthma.  · Do not use any tobacco products, including cigarettes, chewing tobacco, or electronic cigarettes. If you need help quitting, ask your doctor.  GET HELP IF:  · You are getting worse, not better.  · Your symptoms are not helped by medicine.  · You have chills.  · You are getting more short of breath.  · You have brown or red mucus.  · You have yellow or brown discharge from your nose.  · You have pain in your face, especially when you bend forward.  · You have a fever.  · You have puffy (swollen) neck glands.  · You have pain while swallowing.  · You have white areas in the back of your throat.  GET HELP RIGHT AWAY IF:   · You have very bad or constant:    Headache.    Ear pain.    Pain in your forehead, behind your eyes, and over your cheekbones (sinus pain).    Chest pain.  · You have long-lasting (chronic) lung disease and any of the following:    Wheezing.    Long-lasting cough.    Coughing up blood.    A change in your usual mucus.  · You have a stiff neck.  · You have changes in your:    Vision.    Hearing.    Thinking.    Mood.  MAKE SURE YOU:   · Understand these instructions.  · Will watch your condition.  · Will get help right away if you are not doing well or get worse.     This information is not intended to replace advice given to you by your health care provider. Make sure you discuss any questions you have with your health care provider.     Document Released: 06/14/2007 Document Revised: 05/12/2014  Document Reviewed: 04/02/2013  Elsevier Interactive Patient Education ©2016 [employer]

## 2014-12-07 NOTE — ED Notes (Signed)
AAOx3.  Skin warm and dry.  No SOB/ DOE.   

## 2014-12-07 NOTE — ED Provider Notes (Signed)
[facility] Emergency Department Provider Note  ____________________________________________  Time seen: Approximately 11:37 AM  I have reviewed the triage vital signs and the nursing notes.   HISTORY  Chief Complaint Cough and Nasal Congestion    HPI Caitlin Mcbride Height is a 49 y.o. female presents to the emergency room for evaluation of cough congestion 3 days. Patient has a past medical history for HIV positive and has had pneumonia before. Denies any fever chills or sinus pressure at this time. Denies any body aches.   Past Medical History  Diagnosis Date  . HIV (human immunodeficiency virus infection) (HCC)   . Depression   . Reflux     There are no active problems to display for this patient.   Past Surgical History  Procedure Laterality Date  . Abdominal hysterectomy      Current Outpatient Rx  Name  Route  Sig  Dispense  Refill  . alum & mag hydroxide-simeth (MAALOX ADVANCED MAX ST) 400-400-40 MG/5ML suspension   Oral   Take 5 mLs by mouth every 6 (six) hours as needed for indigestion.   355 mL   0   . amoxicillin-clavulanate (AUGMENTIN) 875-125 MG tablet   Oral   Take 1 tablet by mouth 2 (two) times daily.   14 tablet   0   . citalopram (CELEXA) 10 MG tablet   Oral   Take 10 mg by mouth daily.         . darunavir (PREZISTA) 600 MG tablet   Oral   Take 600 mg by mouth 2 (two) times daily.         Marland Kitchen emtricitabine-tenofovir AF (DESCOVY) 200-25 MG per tablet   Oral   Take 1 tablet by mouth daily.         Marland Kitchen gabapentin (NEURONTIN) 300 MG capsule   Oral   Take 300 mg by mouth at bedtime.         Marland Kitchen guaiFENesin-codeine 100-10 MG/5ML syrup   Oral   Take 10 mLs by mouth every 4 (four) hours as needed for cough.   180 mL   0   . LORazepam (ATIVAN) 1 MG tablet   Oral   Take 1 mg by mouth every 8 (eight) hours as needed for anxiety.         Marland Kitchen omeprazole (PRILOSEC) 20 MG capsule   Oral   Take 20 mg by mouth  daily.         . raltegravir (ISENTRESS) 400 MG tablet   Oral   Take 400 mg by mouth 2 (two) times daily.         . risperiDONE (RISPERDAL) 0.5 MG tablet   Oral   Take 0.5 mg by mouth 2 (two) times daily.         . ritonavir (NORVIR) 100 MG TABS tablet   Oral   Take 100 mg by mouth 2 (two) times daily.         . traZODone (DESYREL) 100 MG tablet   Oral   Take 100 mg by mouth at bedtime.         . zidovudine (RETROVIR) 300 MG tablet   Oral   Take 300 mg by mouth 2 (two) times daily.           Allergies Review of patient's allergies indicates no known allergies.  No family history on file.  Social History Social History  Substance Use Topics  . Smoking status: Never Smoker   . Smokeless tobacco:  Never Used  . Alcohol Use: No    Review of Systems Constitutional: No fever/chills Eyes: No visual changes. ENT: No sore throat. Cardiovascular: Denies chest pain. Respiratory: Positive for cough and chest congestion with productive green sputum. Gastrointestinal: No abdominal pain.  No nausea, no vomiting.  No diarrhea.  No constipation. Genitourinary: Negative for dysuria. Musculoskeletal: Negative for back pain. Skin: Negative for rash. Neurological: Negative for headaches, focal weakness or numbness.  10-point ROS otherwise negative.  ____________________________________________   PHYSICAL EXAM:  VITAL SIGNS: ED Triage Vitals  Enc Vitals Group     BP 12/07/14 1034 126/67 mmHg     Pulse Rate 12/07/14 1034 88     Resp 12/07/14 1034 18     Temp 12/07/14 1034 97.7 F (36.5 C)     Temp Source 12/07/14 1034 Oral     SpO2 12/07/14 1034 98 %     Weight 12/07/14 1034 198 lb (89.812 kg)     Height 12/07/14 1034 5\' 3"  (1.6 m)     Head Cir --      Peak Flow --      Pain Score 12/07/14 1034 6     Pain Loc --      Pain Edu? --      Excl. in GC? --     Constitutional: Alert and oriented. Well appearing and in no acute distress. Eyes: Conjunctivae are  normal. PERRL. EOMI. Head: Atraumatic. Nose: No congestion/rhinnorhea. Mouth/Throat: Mucous membranes are moist.  Oropharynx non-erythematous. Neck: No stridor.   Cardiovascular: Normal rate, regular rhythm. Grossly normal heart sounds.  Good peripheral circulation. Respiratory: Normal respiratory effort.  No retractions. Lungs occasional coarse breath sounds but no active wheezing noted. No retractions. Musculoskeletal: No lower extremity tenderness nor edema.  No joint effusions. Neurologic:  Normal speech and language. No gross focal neurologic deficits are appreciated. No gait instability. Skin:  Skin is warm, dry and intact. No rash noted. Psychiatric: Mood and affect are normal. Speech and behavior are normal.  ____________________________________________   LABS (all labs ordered are listed, but only abnormal results are displayed)  Labs Reviewed - No data to display ____________________________________________    RADIOLOGY  Deferred at this visit. ____________________________________________   PROCEDURES  Procedure(s) performed: None  Critical Care performed: No  ____________________________________________   INITIAL IMPRESSION / ASSESSMENT AND PLAN / ED COURSE  Pertinent labs & imaging results that were available during my care of the patient were reviewed by me and considered in my medical decision making (see chart for details).  Past medical history significant for HIV we'll treat for Acute bronchitis/URI. Rx given for Augmentin 875 twice a day 7 days. Rx given for guaifenesin with codeine. Patient to follow up with PCP or return to the ER with any worsening symptomology.  Patient voices no other emergency medical complaints at this time. ____________________________________________   FINAL CLINICAL IMPRESSION(S) / ED DIAGNOSES  Final diagnoses:  Acute bronchitis, unspecified organism  URI, acute      Evangeline DakinCharles M Denaya Horn, PA-C 12/07/14 1142  Myrna Blazeravid  Matthew Schaevitz, MD 12/07/14 [phone]

## 2014-12-07 NOTE — ED Notes (Signed)
Pt reports that she has had cough and congestion since Friday.

## 2015-01-11 ENCOUNTER — Emergency Department
Admission: EM | Admit: 2015-01-11 | Discharge: 2015-01-11 | Disposition: A | Discharge status: Home / Self Care | Payer: Medicare Other | Attending: Emergency Medicine | Admitting: Emergency Medicine

## 2015-01-11 ENCOUNTER — Encounter: Payer: Self-pay | Admitting: Emergency Medicine

## 2015-01-11 ENCOUNTER — Emergency Department: Payer: Medicare Other

## 2015-01-11 DIAGNOSIS — Z21 Asymptomatic human immunodeficiency virus [HIV] infection status: Secondary | ICD-10-CM | POA: Insufficient documentation

## 2015-01-11 DIAGNOSIS — R350 Frequency of micturition: Secondary | ICD-10-CM | POA: Insufficient documentation

## 2015-01-11 DIAGNOSIS — F329 Major depressive disorder, single episode, unspecified: Secondary | ICD-10-CM | POA: Diagnosis not present

## 2015-01-11 DIAGNOSIS — J069 Acute upper respiratory infection, unspecified: Secondary | ICD-10-CM | POA: Insufficient documentation

## 2015-01-11 DIAGNOSIS — R109 Unspecified abdominal pain: Secondary | ICD-10-CM | POA: Diagnosis not present

## 2015-01-11 DIAGNOSIS — M546 Pain in thoracic spine: Secondary | ICD-10-CM | POA: Insufficient documentation

## 2015-01-11 DIAGNOSIS — Z79899 Other long term (current) drug therapy: Secondary | ICD-10-CM | POA: Diagnosis not present

## 2015-01-11 DIAGNOSIS — R05 Cough: Secondary | ICD-10-CM | POA: Diagnosis present

## 2015-01-11 LAB — URINALYSIS COMPLETE WITH MICROSCOPIC (ARMC ONLY)
Bacteria, UA: NONE SEEN
Bilirubin Urine: NEGATIVE
Glucose, UA: NEGATIVE mg/dL
KETONES UR: NEGATIVE mg/dL
Nitrite: NEGATIVE
PH: 5 (ref 5.0–8.0)
PROTEIN: NEGATIVE mg/dL
Specific Gravity, Urine: 1.008 (ref 1.005–1.030)

## 2015-01-11 MED ORDER — TRAMADOL HCL 50 MG PO TABS
50.0000 mg | ORAL_TABLET | Freq: Four times a day (QID) | ORAL | Status: DC | PRN
Start: 2015-01-11 — End: 2022-02-10

## 2015-01-11 MED ORDER — BENZONATATE 100 MG PO CAPS: 100.0000 mg | ORAL_CAPSULE | Freq: Three times a day (TID) | ORAL | Status: DC | PRN

## 2015-01-11 NOTE — ED Notes (Signed)
Pt presents to ED with back pain " that feels like pneumonia" and " I have been peeing a lot but I drink a lot of water". Pt states " she coughs up phlegm for a while".

## 2015-01-11 NOTE — ED Provider Notes (Signed)
[facility] Emergency Department Provider Note  ____________________________________________  Time seen: Approximately 4:44 PM  I have reviewed the triage vital signs and the nursing notes.   HISTORY  Chief Complaint Back Pain; Polyuria; and Cough    HPI Caitlin Mcbride is a 50 y.o. female patient states she's having mid back pain with a productive cough. Patient states she had the same symptoms when she was diagnosed with pneumonia last year. Patient denies any other upper history signs symptoms. Patient also concerned because she's been urinating frequently. Patient denies any dysuria or vaginal discharge. Patient stated  left flank pain. She is rating the pain as a 3/10.. Back pain started last night and a frequency approximately a week. Patient states she does drink a lot of water.   Past Medical History  Diagnosis Date  . HIV (human immunodeficiency virus infection) (HCC)   . Depression   . Reflux     There are no active problems to display for this patient.   Past Surgical History  Procedure Laterality Date  . Abdominal hysterectomy      Current Outpatient Rx  Name  Route  Sig  Dispense  Refill  . alum & mag hydroxide-simeth (MAALOX ADVANCED MAX ST) 400-400-40 MG/5ML suspension   Oral   Take 5 mLs by mouth every 6 (six) hours as needed for indigestion.   355 mL   0   . benzonatate (TESSALON PERLES) 100 MG capsule   Oral   Take 1 capsule (100 mg total) by mouth 3 (three) times daily as needed for cough.   15 capsule   0   . citalopram (CELEXA) 10 MG tablet   Oral   Take 10 mg by mouth daily.         . darunavir (PREZISTA) 600 MG tablet   Oral   Take 600 mg by mouth 2 (two) times daily.         Marland Kitchen. emtricitabine-tenofovir AF (DESCOVY) 200-25 MG per tablet   Oral   Take 1 tablet by mouth daily.         Marland Kitchen. gabapentin (NEURONTIN) 300 MG capsule   Oral   Take 300 mg by mouth at bedtime.         Marland Kitchen. guaiFENesin-codeine 100-10  MG/5ML syrup   Oral   Take 10 mLs by mouth every 4 (four) hours as needed for cough.   180 mL   0   . LORazepam (ATIVAN) 1 MG tablet   Oral   Take 1 mg by mouth every 8 (eight) hours as needed for anxiety.         Marland Kitchen. omeprazole (PRILOSEC) 20 MG capsule   Oral   Take 20 mg by mouth daily.         . raltegravir (ISENTRESS) 400 MG tablet   Oral   Take 400 mg by mouth 2 (two) times daily.         . risperiDONE (RISPERDAL) 0.5 MG tablet   Oral   Take 0.5 mg by mouth 2 (two) times daily.         . ritonavir (NORVIR) 100 MG TABS tablet   Oral   Take 100 mg by mouth 2 (two) times daily.         . traMADol (ULTRAM) 50 MG tablet   Oral   Take 1 tablet (50 mg total) by mouth every 6 (six) hours as needed for moderate pain.   12 tablet   0   . traZODone (  DESYREL) 100 MG tablet   Oral   Take 100 mg by mouth at bedtime.         . zidovudine (RETROVIR) 300 MG tablet   Oral   Take 300 mg by mouth 2 (two) times daily.           Allergies Review of patient's allergies indicates no known allergies.  History reviewed. No pertinent family history.  Social History Social History  Substance Use Topics  . Smoking status: Never Smoker   . Smokeless tobacco: Never Used  . Alcohol Use: No    Review of Systems Constitutional: No fever/chills Eyes: No visual changes. ENT: No sore throat. Cardiovascular: Denies chest pain. Respiratory: Denies shortness of breath. Gastrointestinal: No abdominal pain.  No nausea, no vomiting.  No diarrhea.  No constipation. Genitourinary: Negative for dysuria. Musculoskeletal: Negative for back pain. Skin: Negative for rash. Neurological: Negative for headaches, focal weakness or numbness. Psychiatric:Depression Allergic/Immunilogical: HIV-positive 10-point ROS otherwise negative.  ____________________________________________   PHYSICAL EXAM:  VITAL SIGNS: ED Triage Vitals  Enc Vitals Group     BP 01/11/15 1629 128/64 mmHg      Pulse Rate 01/11/15 1629 85     Resp 01/11/15 1629 18     Temp 01/11/15 1629 98.7 F (37.1 C)     Temp Source 01/11/15 1629 Oral     SpO2 01/11/15 1629 100 %     Weight 01/11/15 1629 192 lb (87.091 kg)     Height 01/11/15 1629 5\' 3"  (1.6 m)     Head Cir --      Peak Flow --      Pain Score --      Pain Loc --      Pain Edu? --      Excl. in GC? --    Constitutional: Alert and oriented. Well appearing and in no acute distress. Eyes: Conjunctivae are normal. PERRL. EOMI. Head: Atraumatic. Nose: No congestion/rhinnorhea. Mouth/Throat: Mucous membranes are moist.  Oropharynx non-erythematous. Neck: No stridor.  No cervical spine tenderness to palpation. Hematological/Lymphatic/Immunilogical: No cervical lymphadenopathy. Cardiovascular: Normal rate, regular rhythm. Grossly normal heart sounds.  Good peripheral circulation. Respiratory: Normal respiratory effort.  No retractions. Lungs CTAB. Gastrointestinal: Soft and nontender. No distention. No abdominal bruits. No CVA tenderness. Musculoskeletal: No lower extremity tenderness nor edema.  No joint effusions. Neurologic:  Normal speech and language. No gross focal neurologic deficits are appreciated. No gait instability. Skin:  Skin is warm, dry and intact. No rash noted. Psychiatric: Mood and affect are normal. Speech and behavior are normal.  ____________________________________________   LABS (all labs ordered are listed, but only abnormal results are displayed)  Labs Reviewed  URINALYSIS COMPLETEWITH MICROSCOPIC ([facility] ONLY) - Abnormal; Notable for the following:    Color, Urine YELLOW (*)    APPearance HAZY (*)    Hgb urine dipstick 1+ (*)    Leukocytes, UA 1+ (*)    Squamous Epithelial / LPF 6-30 (*)    All other components within normal limits   ____________________________________________  EKG   ____________________________________________  RADIOLOGY  No acute findings on chest  x-ray. ____________________________________________   PROCEDURES  Procedure(s) performed: None  Critical Care performed: No  ____________________________________________   INITIAL IMPRESSION / ASSESSMENT AND PLAN / ED COURSE  Pertinent labs & imaging results that were available during my care of the patient were reviewed by me and considered in my medical decision making (see chart for details). Upper rest or infection and left flank pain. Sclerae negative x-ray  results and negative urinalysis with patient. Patient given a prescription for Tessalon Perles and tramadol. Follow up with family doctor if no improvement 2-3 days. ____________________________________________   FINAL CLINICAL IMPRESSION(S) / ED DIAGNOSES  Final diagnoses:  URI (upper respiratory infection)  Flank pain      Joni Reining, PA-C 01/11/15 1817  Loleta Rose, MD 01/11/15 2330

## 2015-01-11 NOTE — Discharge Instructions (Signed)

## 2015-01-11 NOTE — ED Notes (Signed)
States she feels like she has pneumonia..having pain to mid back  With cough

## 2015-05-31 ENCOUNTER — Emergency Department
Admission: EM | Admit: 2015-05-31 | Discharge: 2015-05-31 | Disposition: A | Discharge status: Home / Self Care | Payer: Medicare Other | Attending: Emergency Medicine | Admitting: Emergency Medicine

## 2015-05-31 DIAGNOSIS — F329 Major depressive disorder, single episode, unspecified: Secondary | ICD-10-CM | POA: Insufficient documentation

## 2015-05-31 DIAGNOSIS — R1110 Vomiting, unspecified: Secondary | ICD-10-CM | POA: Diagnosis present

## 2015-05-31 DIAGNOSIS — K529 Noninfective gastroenteritis and colitis, unspecified: Secondary | ICD-10-CM | POA: Diagnosis not present

## 2015-05-31 DIAGNOSIS — B20 Human immunodeficiency virus [HIV] disease: Secondary | ICD-10-CM | POA: Diagnosis not present

## 2015-05-31 DIAGNOSIS — Z79899 Other long term (current) drug therapy: Secondary | ICD-10-CM | POA: Insufficient documentation

## 2015-05-31 LAB — URINALYSIS COMPLETE WITH MICROSCOPIC (ARMC ONLY)
BILIRUBIN URINE: NEGATIVE
GLUCOSE, UA: NEGATIVE mg/dL
KETONES UR: NEGATIVE mg/dL
NITRITE: NEGATIVE
Protein, ur: 30 mg/dL — AB
SPECIFIC GRAVITY, URINE: 1.014 (ref 1.005–1.030)
pH: 5 (ref 5.0–8.0)

## 2015-05-31 LAB — CBC
HEMATOCRIT: 42.3 % (ref 35.0–47.0)
HEMOGLOBIN: 14.2 g/dL (ref 12.0–16.0)
MCH: 31.6 pg (ref 26.0–34.0)
MCHC: 33.6 g/dL (ref 32.0–36.0)
MCV: 94 fL (ref 80.0–100.0)
Platelets: 308 10*3/uL (ref 150–440)
RBC: 4.5 MIL/uL (ref 3.80–5.20)
RDW: 12.9 % (ref 11.5–14.5)
WBC: 13.8 10*3/uL — ABNORMAL HIGH (ref 3.6–11.0)

## 2015-05-31 LAB — COMPREHENSIVE METABOLIC PANEL
ALK PHOS: 82 U/L (ref 38–126)
ALT: 15 U/L (ref 14–54)
ANION GAP: 7 (ref 5–15)
AST: 24 U/L (ref 15–41)
Albumin: 4.5 g/dL (ref 3.5–5.0)
BILIRUBIN TOTAL: 0.9 mg/dL (ref 0.3–1.2)
BUN: 18 mg/dL (ref 6–20)
CALCIUM: 9.4 mg/dL (ref 8.9–10.3)
CO2: 26 mmol/L (ref 22–32)
Chloride: 107 mmol/L (ref 101–111)
Creatinine, Ser: 1.3 mg/dL — ABNORMAL HIGH (ref 0.44–1.00)
GFR calc non Af Amer: 47 mL/min — ABNORMAL LOW (ref >60)
GFR, EST AFRICAN AMERICAN: 54 mL/min — AB (ref >60)
Glucose, Bld: 107 mg/dL — ABNORMAL HIGH (ref 65–99)
POTASSIUM: 3.9 mmol/L (ref 3.5–5.1)
SODIUM: 140 mmol/L (ref 135–145)
TOTAL PROTEIN: 9.4 g/dL — AB (ref 6.5–8.1)

## 2015-05-31 LAB — LIPASE, BLOOD: Lipase: 32 U/L (ref 11–51)

## 2015-05-31 MED ORDER — ONDANSETRON HCL 4 MG PO TABS: ORAL_TABLET | ORAL | Status: AC

## 2015-05-31 NOTE — ED Notes (Signed)
Pt states she ate some "greens" at 2 and then began having episodes of diarrhea and vomiting. Has had 5 episodes of diarrhea and 3 episodes of vomiting total. Describes diarrhea as watery and yellow. Pt states she has tried pedialyte, water, and bepto bismol and has thrown it all up. Pt states she feels weak and states abdominal pain. Pt is alert and oriented, skin warm and dry.

## 2015-05-31 NOTE — ED Notes (Signed)
Pt in with co n.v.d since this am and abd pain, states not able to keep food or fluids down.

## 2015-05-31 NOTE — ED Provider Notes (Signed)
[facility] Emergency Department Provider Note  ____________________________________________  Time seen: Approximately 10:59 PM  I have reviewed the triage vital signs and the nursing notes.   HISTORY  Chief Complaint Emesis    HPI Caitlin Mcbride is a 50 y.o. female a history of HIV who reports that her last CD4 count was "high" and that her viral load was low and is followed at [facility].  She reports to the emergency department today complaining of acute onset diarrhea and vomiting 30-45 minutes after eating lunch.  She thinks that maybe some greens that she ate were bad.  She has had about 5 episodes of watery, nonbloody diarrhea and 3 episodes of vomiting, but she has not vomited for at least 5 hours.  She initially stated in triage that she had some abdominal pain but she told me that she has had no abdominal pain and now she just feels hungry and empty.  She denies fever/chills, chest pain, shortness of breath, dysuria.  She has been drinking bottled water since being in the emergency department and states that she is hungry and ready to go get something to eat.Nothing in particular made her symptoms better or worse while she was feeling ill earlier today.   Past Medical History  Diagnosis Date  . HIV (human immunodeficiency virus infection) (HCC)   . Depression   . Reflux     There are no active problems to display for this patient.   Past Surgical History  Procedure Laterality Date  . Abdominal hysterectomy      Current Outpatient Rx  Name  Route  Sig  Dispense  Refill  . cetirizine (ZYRTEC) 10 MG tablet   Oral   Take 10 mg by mouth daily.         . GENVOYA 150-150-200-10 MG TABS tablet   Oral   Take 1 tablet by mouth daily.           Dispense as written.   Marland Kitchen LORazepam (ATIVAN) 1 MG tablet   Oral   Take 1 mg by mouth every 8 (eight) hours as needed for anxiety.         Marland Kitchen PREZISTA 800 MG tablet   Oral   Take 800 mg by mouth  daily.           Dispense as written.   Marland Kitchen PROAIR HFA 108 (90 Base) MCG/ACT inhaler   Inhalation   Inhale 2 puffs into the lungs every 6 (six) hours as needed.           Dispense as written.   Marland Kitchen alum & mag hydroxide-simeth (MAALOX ADVANCED MAX ST) 400-400-40 MG/5ML suspension   Oral   Take 5 mLs by mouth every 6 (six) hours as needed for indigestion. Patient not taking: Reported on 05/31/2015   355 mL   0   . benzonatate (TESSALON PERLES) 100 MG capsule   Oral   Take 1 capsule (100 mg total) by mouth 3 (three) times daily as needed for cough. Patient not taking: Reported on 05/31/2015   15 capsule   0   . guaiFENesin-codeine 100-10 MG/5ML syrup   Oral   Take 10 mLs by mouth every 4 (four) hours as needed for cough. Patient not taking: Reported on 05/31/2015   180 mL   0   . ondansetron (ZOFRAN) 4 MG tablet      Take 1-2 tabs by mouth every 8 hours as needed for nausea/vomiting   30 tablet  0   . traMADol (ULTRAM) 50 MG tablet   Oral   Take 1 tablet (50 mg total) by mouth every 6 (six) hours as needed for moderate pain. Patient not taking: Reported on 05/31/2015   12 tablet   0     Allergies Review of patient's allergies indicates no known allergies.  No family history on file.  Social History Social History  Substance Use Topics  . Smoking status: Never Smoker   . Smokeless tobacco: Never Used  . Alcohol Use: No    Review of Systems Constitutional: No fever/chills Eyes: No visual changes. ENT: No sore throat. Cardiovascular: Denies chest pain. Respiratory: Denies shortness of breath. Gastrointestinal: No abdominal pain.  +N/V/D since earlier today Genitourinary: Negative for dysuria. Musculoskeletal: Negative for back pain. Skin: Negative for rash. Neurological: Negative for headaches, focal weakness or numbness.  10-point ROS otherwise negative.  ____________________________________________   PHYSICAL EXAM:  VITAL SIGNS: ED Triage Vitals   Enc Vitals Group     BP 05/31/15 1938 128/90 mmHg     Pulse Rate 05/31/15 1938 103     Resp 05/31/15 1938 18     Temp 05/31/15 1938 98.2 F (36.8 C)     Temp Source 05/31/15 1938 Oral     SpO2 05/31/15 1938 100 %     Weight 05/31/15 1938 172 lb (78.019 kg)     Height 05/31/15 1938 5\' 3"  (1.6 m)     Head Cir --      Peak Flow --      Pain Score 05/31/15 1938 5     Pain Loc --      Pain Edu? --      Excl. in GC? --     Constitutional: Alert and oriented. Well appearing and in no acute distress. In good spirits, talkative, laughing and joking with me. Eyes: Conjunctivae are normal. PERRL. EOMI. Head: Atraumatic. Nose: No congestion/rhinnorhea. Mouth/Throat: Mucous membranes are moist.  Oropharynx non-erythematous. Neck: No stridor.  No meningeal signs.   Cardiovascular: Normal rate, regular rhythm. Good peripheral circulation. Grossly normal heart sounds.   Respiratory: Normal respiratory effort.  No retractions. Lungs CTAB. Gastrointestinal: Soft and nontender to deep palpation. Musculoskeletal: No lower extremity tenderness nor edema. No gross deformities of extremities. Neurologic:  Normal speech and language. No gross focal neurologic deficits are appreciated.  Skin:  Skin is warm, dry and intact. No rash noted. Psychiatric: Mood and affect are normal. Speech and behavior are normal.  ____________________________________________   LABS (all labs ordered are listed, but only abnormal results are displayed)  Labs Reviewed  CBC - Abnormal; Notable for the following:    WBC 13.8 (*)    All other components within normal limits  COMPREHENSIVE METABOLIC PANEL - Abnormal; Notable for the following:    Glucose, Bld 107 (*)    Creatinine, Ser 1.30 (*)    Total Protein 9.4 (*)    GFR calc non Af Amer 47 (*)    GFR calc Af Amer 54 (*)    All other components within normal limits  URINALYSIS COMPLETEWITH MICROSCOPIC ([facility] ONLY) - Abnormal; Notable for the following:    Color,  Urine YELLOW (*)    APPearance CLOUDY (*)    Hgb urine dipstick 1+ (*)    Protein, ur 30 (*)    Leukocytes, UA TRACE (*)    Bacteria, UA RARE (*)    Squamous Epithelial / LPF TOO NUMEROUS TO COUNT (*)    All other components within normal  limits  LIPASE, BLOOD   ____________________________________________  EKG  None ____________________________________________  RADIOLOGY   No results found.  ____________________________________________   PROCEDURES  Procedure(s) performed: None  Critical Care performed: No ____________________________________________   INITIAL IMPRESSION / ASSESSMENT AND PLAN / ED COURSE  Pertinent labs & imaging results that were available during my care of the patient were reviewed by me and considered in my medical decision making (see chart for details).  You have the patient has a past medical history of HIV, she is very well-appearing with resolved (normal) vital signs and an unremarkable workup.  Her creatinine is consistent with prior lab values her baseline appears to be 1.3.  She has the ability to follow closely with infectious disease at [facility].  She most likely has a viral gastroenteritis or bad food exposure.  Given her ability to follow closely, I think it is most appropriate that she be discharged home and call them in the morning to see if they want to obtain outpatient stool studies or other additional infectious disease workup.  At this time, however, she is very well-appearing is eager to go home and eat.  I gave my usual customary return precautions.   ____________________________________________  FINAL CLINICAL IMPRESSION(S) / ED DIAGNOSES  Final diagnoses:  Gastroenteritis     MEDICATIONS GIVEN DURING THIS VISIT:  Medications - No data to display   NEW OUTPATIENT MEDICATIONS STARTED DURING THIS VISIT:  New Prescriptions   ONDANSETRON (ZOFRAN) 4 MG TABLET    Take 1-2 tabs by mouth every 8 hours as needed for  nausea/vomiting      Note:  This document was prepared using Dragon voice recognition software and may include unintentional dictation errors.   Loleta Rose, MD 05/31/15 [phone]

## 2015-05-31 NOTE — Discharge Instructions (Signed)
We believe your symptoms are caused by either a viral infection or possible a bad food exposure.  Either way, since your symptoms have improved, we feel it is safe for you to go home and follow up with your regular doctor.  Please read the included information and stick to a bland diet for the next two days.  Drink plenty of clear fluids, and if you were provided with a prescription, please take it according to the label instructions.    Given your chronic medical issues, we recommend that you call your infectious disease doctor tomorrow morning and tell them about your symptoms and your visit to the emergency department tonight.  Even though your workup is reassuring, they may have additional studies that the need to perform as an outpatient, such as stool cultures or other infectious disease specific evaluations.  Please do call them at the next available opportunity.  If you develop any new or worsening symptoms, including persistent vomiting not controlled with medication, fever greater than 101, severe or worsening abdominal pain, or other symptoms that concern you, please return immediately to the Emergency Department.  Viral Gastroenteritis  Viral gastroenteritis is also known as stomach flu. This condition affects the stomach and intestinal tract. It can cause sudden diarrhea and vomiting. The illness typically lasts 3 to 8 days. Most people develop an immune response that eventually gets rid of the virus. While this natural response develops, the virus can make you quite ill.  CAUSES  Many different viruses can cause gastroenteritis, such as rotavirus or noroviruses. You can catch one of these viruses by consuming contaminated food or water. You may also catch a virus by sharing utensils or other personal items with an infected person or by touching a contaminated surface.  SYMPTOMS  The most common symptoms are diarrhea and vomiting. These problems can cause a severe loss of body fluids  (dehydration) and a body salt (electrolyte) imbalance. Other symptoms may include:  Fever.  Headache.  Fatigue.  Abdominal pain. DIAGNOSIS  Your caregiver can usually diagnose viral gastroenteritis based on your symptoms and a physical exam. A stool sample may also be taken to test for the presence of viruses or other infections.  TREATMENT  This illness typically goes away on its own. Treatments are aimed at rehydration. The most serious cases of viral gastroenteritis involve vomiting so severely that you are not able to keep fluids down. In these cases, fluids must be given through an intravenous line (IV).  HOME CARE INSTRUCTIONS  Drink enough fluids to keep your urine clear or pale yellow. Drink small amounts of fluids frequently and increase the amounts as tolerated.  Ask your caregiver for specific rehydration instructions.  Avoid:  Foods high in sugar.  Alcohol.  Carbonated drinks.  Tobacco.  Juice.  Caffeine drinks.  Extremely hot or cold fluids.  Fatty, greasy foods.  Too much intake of anything at one time.  Dairy products until 24 to 48 hours after diarrhea stops. You may consume probiotics. Probiotics are active cultures of beneficial bacteria. They may lessen the amount and number of diarrheal stools in adults. Probiotics can be found in yogurt with active cultures and in supplements.  Wash your hands well to avoid spreading the virus.  Only take over-the-counter or prescription medicines for pain, discomfort, or fever as directed by your caregiver. Do not give aspirin to children. Antidiarrheal medicines are not recommended.  Ask your caregiver if you should continue to take your regular prescribed and over-the-counter medicines.  Keep all follow-up appointments as directed by your caregiver. SEEK IMMEDIATE MEDICAL CARE IF:  You are unable to keep fluids down.  You do not urinate at least once every 6 to 8 hours.  You develop shortness of breath.  You notice blood in  your stool or vomit. This may look like coffee grounds.  You have abdominal pain that increases or is concentrated in one small area (localized).  You have persistent vomiting or diarrhea.  You have a fever.  The patient is a child younger than 3 months, and he or she has a fever.  The patient is a child older than 3 months, and he or she has a fever and persistent symptoms.  The patient is a child older than 3 months, and he or she has a fever and symptoms suddenly get worse.  The patient is a baby, and he or she has no tears when crying. MAKE SURE YOU:  Understand these instructions.  Will watch your condition.  Will get help right away if you are not doing well or get worse. This information is not intended to replace advice given to you by your health care provider. Make sure you discuss any questions you have with your health care provider.  Document Released: 12/26/2004 Document Revised: 03/20/2011 Document Reviewed: 10/12/2010  Elsevier Interactive Patient Education [employer]2016 [employer]

## 2016-06-25 IMAGING — CR DG CHEST 2V
1 series · 2 of 2 positions shown · non-contrast
Comparison: PA and lateral chest 08/21/2014 and 10/30/2007.

CLINICAL DATA: Chest tightness for 2 weeks.  Subsequent encounter.

EXAM:
CHEST  2 VIEW

[Series 1: dg chest 2 view · 0.14mm/px · 2 of 2 slices shown]
[im 1/2]
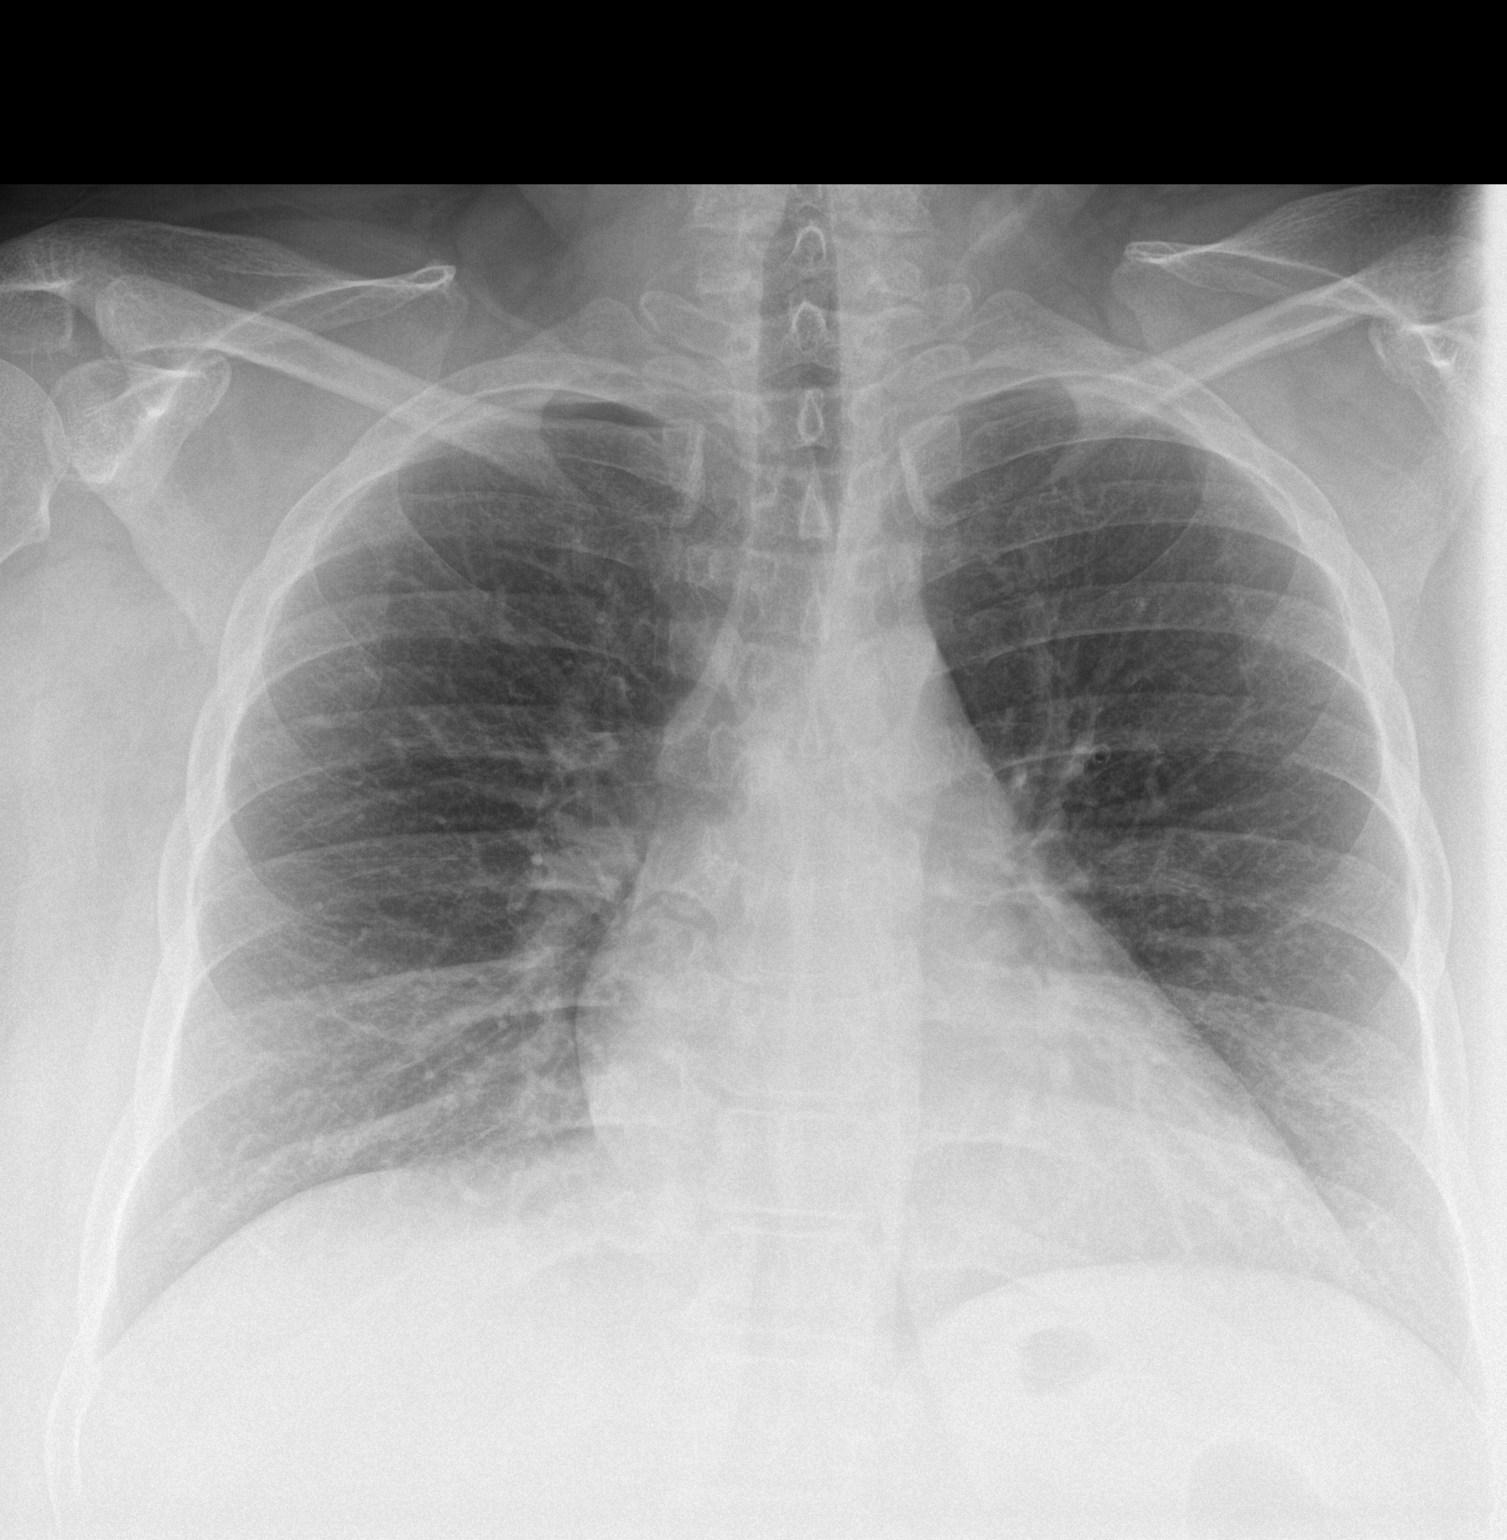
[im 2/2]
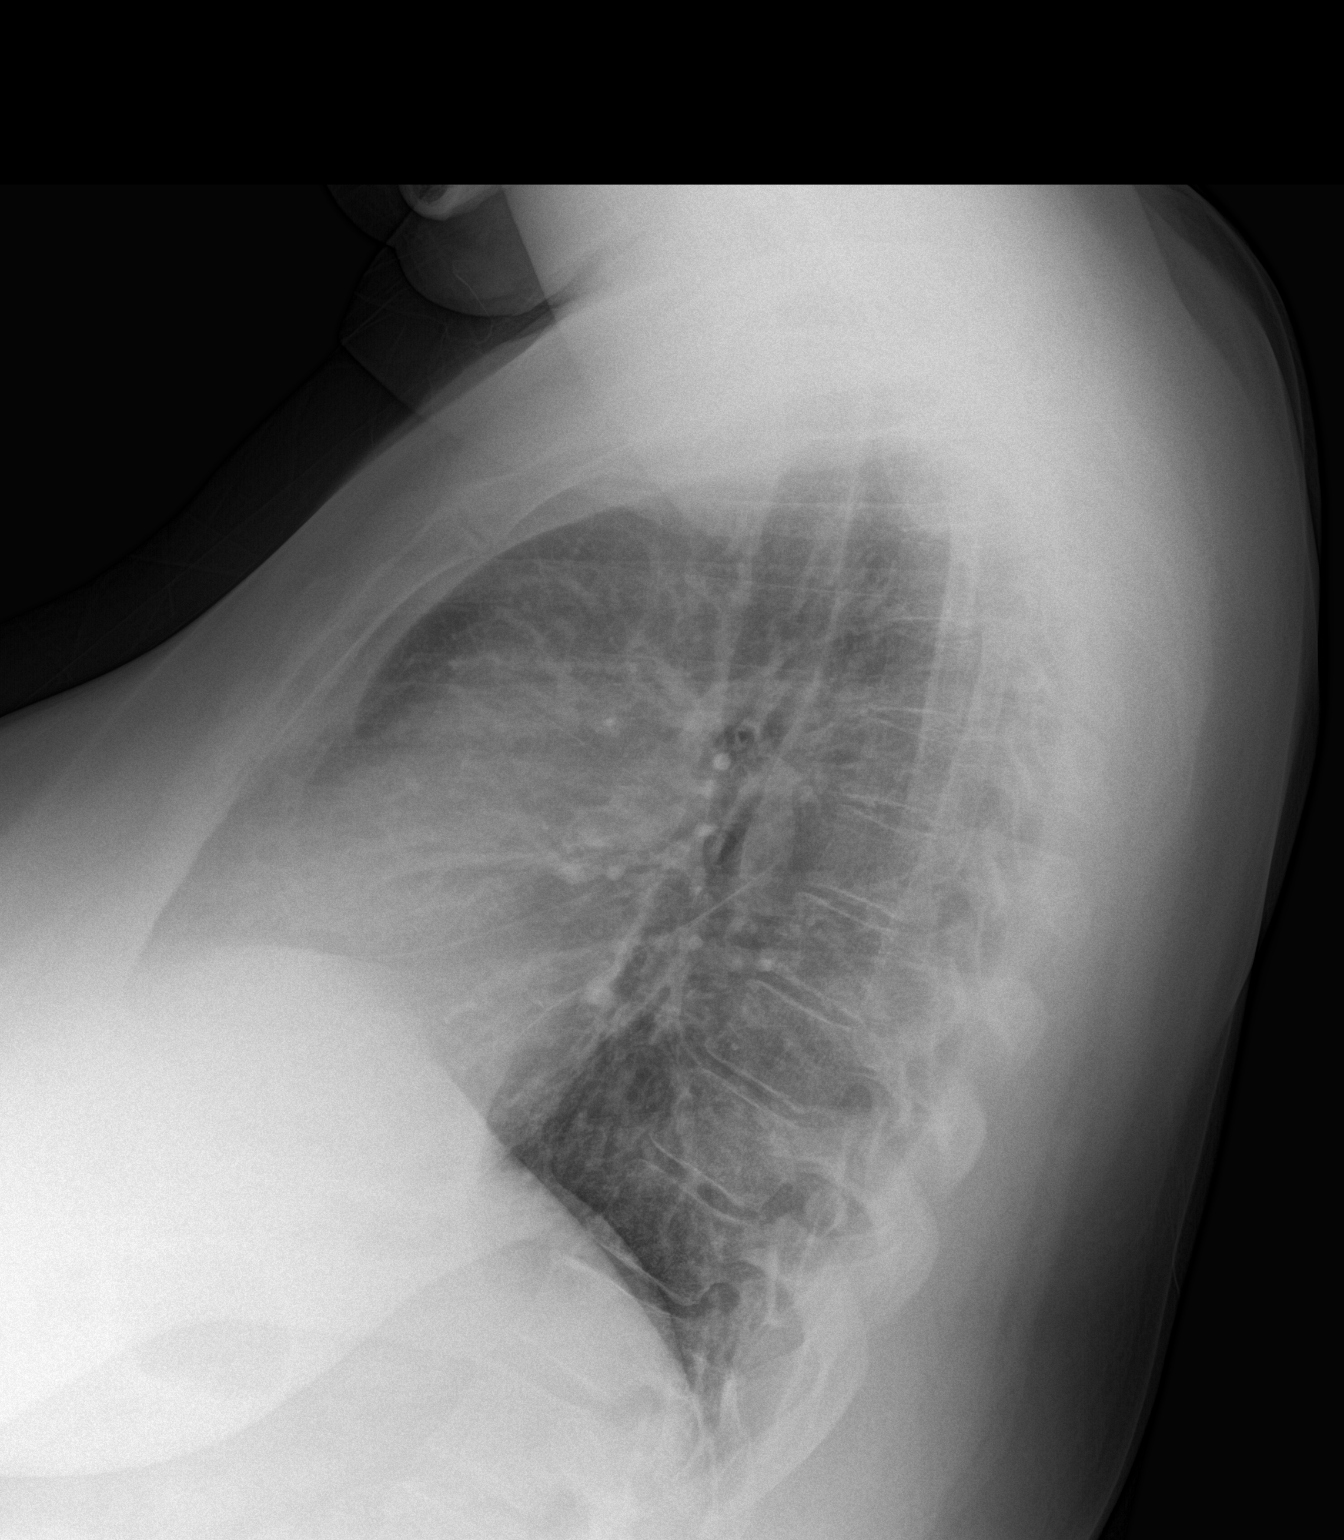

[2 of 2 positions shown; findings below may reference images not displayed]

FINDINGS: The lungs are clear. Heart size is normal. No pneumothorax pleural
effusion. No focal bony abnormality.
IMPRESSION: No active cardiopulmonary disease.

## 2016-11-06 ENCOUNTER — Encounter: Payer: Self-pay | Admitting: Emergency Medicine

## 2016-11-06 ENCOUNTER — Emergency Department: Payer: Medicare Other

## 2016-11-06 ENCOUNTER — Emergency Department
Admission: EM | Admit: 2016-11-06 | Discharge: 2016-11-06 | Disposition: A | Discharge status: Home / Self Care | Payer: Medicare Other | Attending: Emergency Medicine | Admitting: Emergency Medicine

## 2016-11-06 DIAGNOSIS — Z79899 Other long term (current) drug therapy: Secondary | ICD-10-CM | POA: Insufficient documentation

## 2016-11-06 DIAGNOSIS — R202 Paresthesia of skin: Secondary | ICD-10-CM | POA: Diagnosis present

## 2016-11-06 LAB — TROPONIN I: Troponin I: <0.03 ng/mL (ref <0.03)

## 2016-11-06 LAB — BASIC METABOLIC PANEL
Anion gap: 6 (ref 5–15)
BUN: 16 mg/dL (ref 6–20)
CHLORIDE: 103 mmol/L (ref 101–111)
CO2: 27 mmol/L (ref 22–32)
CREATININE: 1.1 mg/dL — AB (ref 0.44–1.00)
Calcium: 8.9 mg/dL (ref 8.9–10.3)
GFR calc Af Amer: >60 mL/min (ref >60)
GFR calc non Af Amer: 57 mL/min — ABNORMAL LOW (ref >60)
Glucose, Bld: 120 mg/dL — ABNORMAL HIGH (ref 65–99)
Potassium: 3.9 mmol/L (ref 3.5–5.1)
SODIUM: 136 mmol/L (ref 135–145)

## 2016-11-06 LAB — CBC
HCT: 39.4 % (ref 35.0–47.0)
Hemoglobin: 13.2 g/dL (ref 12.0–16.0)
MCH: 31.2 pg (ref 26.0–34.0)
MCHC: 33.5 g/dL (ref 32.0–36.0)
MCV: 93.3 fL (ref 80.0–100.0)
PLATELETS: 311 10*3/uL (ref 150–440)
RBC: 4.23 MIL/uL (ref 3.80–5.20)
RDW: 12.6 % (ref 11.5–14.5)
WBC: 8.7 10*3/uL (ref 3.6–11.0)

## 2016-11-06 NOTE — ED Notes (Signed)
Patient ambulatory to lobby with NAD noted. Verbalized understanding of discharge instructions and follow-up care. 

## 2016-11-06 NOTE — ED Provider Notes (Signed)
[facility] Emergency Department Provider Note  ____________________________________________   I have reviewed the triage vital signs and the nursing notes.   HISTORY  Chief Complaint Numbness    HPI Caitlin Mcbride is a 51 y.o. female who presents today stating that her cheek was numb yesterday but is better today.  Patient states that she did not have any weakness.  She had a area of tingling around the left cheek which acid for a brief period of time.  It was "like a headache but not a headache".  He had no other symptoms and the symptoms have completely resolved at this time.  It started yesterday and lasted for a period of time is not very specific that sounds like perhaps an hour.  Patient is a very tangential and poor historian.  She states she also had some tingling in the back of her head as well which was very brief in onset and duration.  She denies any headache stiff neck fever chills focal weakness difficulty speaking facial droop or other concerns.  At this time she feels 100% normal.    Past Medical History:  Diagnosis Date  . Depression   . HIV (human immunodeficiency virus infection) (HCC)   . Reflux     There are no active problems to display for this patient.   Past Surgical History:  Procedure Laterality Date  . ABDOMINAL HYSTERECTOMY      Prior to Admission medications   Medication Sig Start Date End Date Taking? Authorizing Provider  alum & mag hydroxide-simeth (MAALOX ADVANCED MAX ST) 400-400-40 MG/5ML suspension Take 5 mLs by mouth every 6 (six) hours as needed for indigestion. Patient not taking: Reported on 05/31/2015 08/24/14   Minna AntisPaduchowski, Kevin, MD  benzonatate (TESSALON PERLES) 100 MG capsule Take 1 capsule (100 mg total) by mouth 3 (three) times daily as needed for cough. Patient not taking: Reported on 05/31/2015 01/11/15   Joni ReiningSmith, Ronald K, PA-C  cetirizine (ZYRTEC) 10 MG tablet Take 10 mg by mouth daily.    [provider]  GENVOYA 150-150-200-10 MG TABS tablet Take 1 tablet by mouth daily.    [provider]  guaiFENesin-codeine 100-10 MG/5ML syrup Take 10 mLs by mouth every 4 (four) hours as needed for cough. Patient not taking: Reported on 05/31/2015 12/07/14   Evangeline DakinBeers, Charles M, PA-C  LORazepam (ATIVAN) 1 MG tablet Take 1 mg by mouth every 8 (eight) hours as needed for anxiety.    [provider]  ondansetron (ZOFRAN) 4 MG tablet Take 1-2 tabs by mouth every 8 hours as needed for nausea/vomiting 05/31/15   Loleta RoseForbach, Cory, MD  PREZISTA 800 MG tablet Take 800 mg by mouth daily.    [provider]  PROAIR HFA 108 [phone](90 Base) MCG/ACT inhaler Inhale 2 puffs into the lungs every 6 (six) hours as needed.    [provider]  traMADol (ULTRAM) 50 MG tablet Take 1 tablet (50 mg total) by mouth every 6 (six) hours as needed for moderate pain. Patient not taking: Reported on 05/31/2015 01/11/15   Joni ReiningSmith, Ronald K, PA-C    Allergies Patient has no known allergies.  No family history on file.  Social History Social History  Substance Use Topics  . Smoking status: Never Smoker  . Smokeless tobacco: Never Used  . Alcohol use No    Review of Systems Constitutional: No fever/chills Eyes: No visual changes. ENT: No sore throat. No stiff neck no neck pain Cardiovascular: Denies chest pain. Respiratory:  Denies shortness of breath. Gastrointestinal:   no vomiting.  No diarrhea.  No constipation. Genitourinary: Negative for dysuria. Musculoskeletal: Negative lower extremity swelling Skin: Negative for rash. Neurological: Negative for severe headaches, focal weakness    ____________________________________________   PHYSICAL EXAM:  VITAL SIGNS: ED Triage Vitals  Enc Vitals Group     BP 11/06/16 1304 131/77     Pulse Rate 11/06/16 1304 89     Resp 11/06/16 1304 20     Temp 11/06/16 1304 98.2 F (36.8 C)     Temp Source 11/06/16 1304 Oral     SpO2 11/06/16 1304 96  %     Weight 11/06/16 1306 205 lb 1.6 oz (93 kg)     Height 11/06/16 1306 5\' 3"  (1.6 m)     Head Circumference --      Peak Flow --      Pain Score 11/06/16 1330 3     Pain Loc --      Pain Edu? --      Excl. in GC? --     Constitutional: Alert and oriented. Well appearing and in no acute distress. Eyes: Conjunctivae are normal Head: Atraumatic HEENT: No congestion/rhinnorhea. Mucous membranes are moist.  Oropharynx non-erythematous Neck:   Nontender with no meningismus, no masses, no stridor Cardiovascular: Normal rate, regular rhythm. Grossly normal heart sounds.  Good peripheral circulation. Respiratory: Normal respiratory effort.  No retractions. Lungs CTAB. Abdominal: Soft and nontender. No distention. No guarding no rebound Back:  There is no focal tenderness or step off.  there is no midline tenderness there are no lesions noted. there is no CVA tenderness Musculoskeletal: No lower extremity tenderness, no upper extremity tenderness. No joint effusions, no DVT signs strong distal pulses no edema Neurologic: Cranial nerves II through XII are grossly intact 5 out of 5 strength bilateral upper and lower extremity. Finger to nose within normal limits heel to shin within normal limits, speech is normal with no word finding difficulty or dysarthria, reflexes symmetric, pupils are equally round and reactive to light, there is no pronator drift, sensation is normal, vision is intact to confrontation, gait is deferred, there is no nystagmus, normal neurologic exam Skin:  Skin is warm, dry and intact. No rash noted. Psychiatric: Mood and affect are normal. Speech and behavior are normal.  ____________________________________________   LABS (all labs ordered are listed, but only abnormal results are displayed)  Labs Reviewed  BASIC METABOLIC PANEL - Abnormal; Notable for the following:       Result Value   Glucose, Bld 120 (*)    Creatinine, Ser 1.10 (*)    GFR calc non Af Amer 57 (*)     All other components within normal limits  CBC  TROPONIN I    Pertinent labs  results that were available during my care of the patient were reviewed by me and considered in my medical decision making (see chart for details). ____________________________________________  EKG  I personally interpreted any EKGs ordered by me or triage  ____________________________________________  RADIOLOGY  Pertinent labs & imaging results that were available during my care of the patient were reviewed by me and considered in my medical decision making (see chart for details). If possible, patient and/or family made aware of any abnormal findings. ____________________________________________    PROCEDURES  Procedure(s) performed: None  Procedures  Critical Care performed: None  ____________________________________________   INITIAL IMPRESSION / ASSESSMENT AND PLAN / ED COURSE  Pertinent labs & imaging results that were available  during my care of the patient were reviewed by me and considered in my medical decision making (see chart for details).  Since NIH stroke scale is 0, she apparently had some tingling in her cheek only her cheek with no facial droop or other concerns, this was yesterday, she may have had a little tingling today but she does not think so, she is at her baseline according to family who are in the room there is no evidence of stroke NIH stroke scale is 0 unclear what tingling that was, also had a tingling in a very small area in the back of her head this about the size of 3 inches x 2 inches, and again there is no evidence of that at this time.  She did not have any thing that seems consistent with a CVA to me but we will obtain a CT scan of the head to further evaluate.  Patient time of onset of symptoms was well over 24 hours ago.  Differential diagnosis does include CVA, peripheral neuropathy, patient is at great pains to me that she is very stressed and she believes this  is a stress reaction she states that it went away once her family prayed over it.  She states that she has been very anxious but she has no SI or HI.  If CT is negative and a normal exam continues, I think patient was safe for close outpatient follow-up.    ____________________________________________   FINAL CLINICAL IMPRESSION(S) / ED DIAGNOSES  Final diagnoses:  None      This chart was dictated using voice recognition software.  Despite best efforts to proofread,  errors can occur which can change meaning.      Jeanmarie Plant, MD 11/06/16 1655

## 2016-11-06 NOTE — ED Triage Notes (Signed)
Says she had onset left side facial numbness on Saturday.  It went to back of head and left side of head felt like headache.  Says the numbness is gone, but the headache is still there.  Says she is under a lot of stress watching her grand children.

## 2019-01-22 ENCOUNTER — Ambulatory Visit: Payer: Medicare Other | Attending: Internal Medicine

## 2019-01-22 DIAGNOSIS — Z20822 Contact with and (suspected) exposure to covid-19: Secondary | ICD-10-CM

## 2019-01-23 LAB — NOVEL CORONAVIRUS, NAA: SARS-CoV-2, NAA: NOT DETECTED

## 2019-02-14 DIAGNOSIS — H40031 Anatomical narrow angle, right eye: Secondary | ICD-10-CM | POA: Diagnosis not present

## 2019-02-21 ENCOUNTER — Ambulatory Visit: Payer: Medicare HMO | Attending: Internal Medicine

## 2019-02-21 DIAGNOSIS — Z20822 Contact with and (suspected) exposure to covid-19: Secondary | ICD-10-CM

## 2019-02-22 LAB — NOVEL CORONAVIRUS, NAA: SARS-CoV-2, NAA: NOT DETECTED

## 2019-03-05 ENCOUNTER — Telehealth: Payer: Self-pay | Admitting: *Deleted

## 2019-03-05 NOTE — Telephone Encounter (Signed)
Patient called given negative covid results . 

## 2019-03-14 DIAGNOSIS — H40053 Ocular hypertension, bilateral: Secondary | ICD-10-CM | POA: Diagnosis not present

## 2019-03-17 DIAGNOSIS — I10 Essential (primary) hypertension: Secondary | ICD-10-CM | POA: Diagnosis not present

## 2019-03-17 DIAGNOSIS — E669 Obesity, unspecified: Secondary | ICD-10-CM | POA: Diagnosis not present

## 2019-03-17 DIAGNOSIS — B20 Human immunodeficiency virus [HIV] disease: Secondary | ICD-10-CM | POA: Diagnosis not present

## 2019-03-17 DIAGNOSIS — Z9189 Other specified personal risk factors, not elsewhere classified: Secondary | ICD-10-CM | POA: Diagnosis not present

## 2019-03-17 DIAGNOSIS — N183 Chronic kidney disease, stage 3 unspecified: Secondary | ICD-10-CM | POA: Diagnosis not present

## 2019-03-17 DIAGNOSIS — Z23 Encounter for immunization: Secondary | ICD-10-CM | POA: Diagnosis not present

## 2019-03-17 DIAGNOSIS — R87612 Low grade squamous intraepithelial lesion on cytologic smear of cervix (LGSIL): Secondary | ICD-10-CM | POA: Diagnosis not present

## 2019-03-17 DIAGNOSIS — K219 Gastro-esophageal reflux disease without esophagitis: Secondary | ICD-10-CM | POA: Diagnosis not present

## 2019-03-21 DIAGNOSIS — H40053 Ocular hypertension, bilateral: Secondary | ICD-10-CM | POA: Diagnosis not present

## 2019-04-18 DIAGNOSIS — H40039 Anatomical narrow angle, unspecified eye: Secondary | ICD-10-CM | POA: Diagnosis not present

## 2019-09-10 DIAGNOSIS — Z79899 Other long term (current) drug therapy: Secondary | ICD-10-CM | POA: Diagnosis not present

## 2019-09-29 DIAGNOSIS — R05 Cough: Secondary | ICD-10-CM | POA: Diagnosis not present

## 2019-09-29 DIAGNOSIS — Z23 Encounter for immunization: Secondary | ICD-10-CM | POA: Diagnosis not present

## 2019-09-29 DIAGNOSIS — J4520 Mild intermittent asthma, uncomplicated: Secondary | ICD-10-CM | POA: Diagnosis not present

## 2019-09-29 DIAGNOSIS — J329 Chronic sinusitis, unspecified: Secondary | ICD-10-CM | POA: Diagnosis not present

## 2019-11-07 DIAGNOSIS — J301 Allergic rhinitis due to pollen: Secondary | ICD-10-CM | POA: Diagnosis not present

## 2019-11-07 DIAGNOSIS — K219 Gastro-esophageal reflux disease without esophagitis: Secondary | ICD-10-CM | POA: Diagnosis not present

## 2019-11-07 DIAGNOSIS — J329 Chronic sinusitis, unspecified: Secondary | ICD-10-CM | POA: Diagnosis not present

## 2019-11-07 DIAGNOSIS — R059 Cough, unspecified: Secondary | ICD-10-CM | POA: Diagnosis not present

## 2019-11-21 DIAGNOSIS — R059 Cough, unspecified: Secondary | ICD-10-CM | POA: Diagnosis not present

## 2019-11-21 DIAGNOSIS — J329 Chronic sinusitis, unspecified: Secondary | ICD-10-CM | POA: Diagnosis not present

## 2019-11-21 DIAGNOSIS — J301 Allergic rhinitis due to pollen: Secondary | ICD-10-CM | POA: Diagnosis not present

## 2019-11-24 DIAGNOSIS — Z7289 Other problems related to lifestyle: Secondary | ICD-10-CM | POA: Diagnosis not present

## 2019-11-24 DIAGNOSIS — J309 Allergic rhinitis, unspecified: Secondary | ICD-10-CM | POA: Diagnosis not present

## 2019-11-24 DIAGNOSIS — Z0000 Encounter for general adult medical examination without abnormal findings: Secondary | ICD-10-CM | POA: Diagnosis not present

## 2019-11-24 DIAGNOSIS — Z1159 Encounter for screening for other viral diseases: Secondary | ICD-10-CM | POA: Diagnosis not present

## 2019-11-24 DIAGNOSIS — J301 Allergic rhinitis due to pollen: Secondary | ICD-10-CM | POA: Diagnosis not present

## 2019-11-24 DIAGNOSIS — I10 Essential (primary) hypertension: Secondary | ICD-10-CM | POA: Diagnosis not present

## 2019-12-19 DIAGNOSIS — H40053 Ocular hypertension, bilateral: Secondary | ICD-10-CM | POA: Diagnosis not present

## 2019-12-26 DIAGNOSIS — J320 Chronic maxillary sinusitis: Secondary | ICD-10-CM | POA: Diagnosis not present

## 2019-12-26 DIAGNOSIS — J310 Chronic rhinitis: Secondary | ICD-10-CM | POA: Diagnosis not present

## 2019-12-26 DIAGNOSIS — N183 Chronic kidney disease, stage 3 unspecified: Secondary | ICD-10-CM | POA: Diagnosis not present

## 2019-12-26 DIAGNOSIS — J301 Allergic rhinitis due to pollen: Secondary | ICD-10-CM | POA: Diagnosis not present

## 2019-12-26 DIAGNOSIS — J343 Hypertrophy of nasal turbinates: Secondary | ICD-10-CM | POA: Diagnosis not present

## 2019-12-26 DIAGNOSIS — J329 Chronic sinusitis, unspecified: Secondary | ICD-10-CM | POA: Diagnosis not present

## 2019-12-26 DIAGNOSIS — K219 Gastro-esophageal reflux disease without esophagitis: Secondary | ICD-10-CM | POA: Diagnosis not present

## 2019-12-26 DIAGNOSIS — R059 Cough, unspecified: Secondary | ICD-10-CM | POA: Diagnosis not present

## 2020-02-05 DIAGNOSIS — R059 Cough, unspecified: Secondary | ICD-10-CM | POA: Diagnosis not present

## 2020-02-05 DIAGNOSIS — J301 Allergic rhinitis due to pollen: Secondary | ICD-10-CM | POA: Diagnosis not present

## 2020-02-05 DIAGNOSIS — L299 Pruritus, unspecified: Secondary | ICD-10-CM | POA: Diagnosis not present

## 2020-02-05 DIAGNOSIS — J320 Chronic maxillary sinusitis: Secondary | ICD-10-CM | POA: Diagnosis not present

## 2020-02-27 DIAGNOSIS — R059 Cough, unspecified: Secondary | ICD-10-CM | POA: Diagnosis not present

## 2020-02-27 DIAGNOSIS — J301 Allergic rhinitis due to pollen: Secondary | ICD-10-CM | POA: Diagnosis not present

## 2020-02-27 DIAGNOSIS — K219 Gastro-esophageal reflux disease without esophagitis: Secondary | ICD-10-CM | POA: Diagnosis not present

## 2020-02-27 DIAGNOSIS — J320 Chronic maxillary sinusitis: Secondary | ICD-10-CM | POA: Diagnosis not present

## 2020-03-29 DIAGNOSIS — Z23 Encounter for immunization: Secondary | ICD-10-CM | POA: Diagnosis not present

## 2020-03-29 DIAGNOSIS — J310 Chronic rhinitis: Secondary | ICD-10-CM | POA: Diagnosis not present

## 2020-03-29 DIAGNOSIS — J4520 Mild intermittent asthma, uncomplicated: Secondary | ICD-10-CM | POA: Diagnosis not present

## 2020-04-30 DIAGNOSIS — J301 Allergic rhinitis due to pollen: Secondary | ICD-10-CM | POA: Diagnosis not present

## 2020-04-30 DIAGNOSIS — L299 Pruritus, unspecified: Secondary | ICD-10-CM | POA: Diagnosis not present

## 2020-05-14 DIAGNOSIS — Z1231 Encounter for screening mammogram for malignant neoplasm of breast: Secondary | ICD-10-CM | POA: Diagnosis not present

## 2020-05-24 DIAGNOSIS — S51812A Laceration without foreign body of left forearm, initial encounter: Secondary | ICD-10-CM | POA: Diagnosis not present

## 2020-05-24 DIAGNOSIS — J302 Other seasonal allergic rhinitis: Secondary | ICD-10-CM | POA: Diagnosis not present

## 2020-05-24 DIAGNOSIS — K219 Gastro-esophageal reflux disease without esophagitis: Secondary | ICD-10-CM | POA: Diagnosis not present

## 2020-05-24 DIAGNOSIS — Z7952 Long term (current) use of systemic steroids: Secondary | ICD-10-CM | POA: Diagnosis not present

## 2020-05-24 DIAGNOSIS — Z79899 Other long term (current) drug therapy: Secondary | ICD-10-CM | POA: Diagnosis not present

## 2020-05-24 DIAGNOSIS — Z23 Encounter for immunization: Secondary | ICD-10-CM | POA: Diagnosis not present

## 2020-05-24 DIAGNOSIS — R799 Abnormal finding of blood chemistry, unspecified: Secondary | ICD-10-CM | POA: Diagnosis not present

## 2020-05-24 DIAGNOSIS — B372 Candidiasis of skin and nail: Secondary | ICD-10-CM | POA: Diagnosis not present

## 2020-05-24 DIAGNOSIS — L309 Dermatitis, unspecified: Secondary | ICD-10-CM | POA: Diagnosis not present

## 2020-05-24 DIAGNOSIS — Z1159 Encounter for screening for other viral diseases: Secondary | ICD-10-CM | POA: Diagnosis not present

## 2020-05-24 DIAGNOSIS — N183 Chronic kidney disease, stage 3 unspecified: Secondary | ICD-10-CM | POA: Diagnosis not present

## 2020-05-24 DIAGNOSIS — I129 Hypertensive chronic kidney disease with stage 1 through stage 4 chronic kidney disease, or unspecified chronic kidney disease: Secondary | ICD-10-CM | POA: Diagnosis not present

## 2020-06-03 DIAGNOSIS — J301 Allergic rhinitis due to pollen: Secondary | ICD-10-CM | POA: Diagnosis not present

## 2020-07-16 DIAGNOSIS — H401131 Primary open-angle glaucoma, bilateral, mild stage: Secondary | ICD-10-CM | POA: Diagnosis not present

## 2020-08-05 DIAGNOSIS — J301 Allergic rhinitis due to pollen: Secondary | ICD-10-CM | POA: Diagnosis not present

## 2020-08-05 DIAGNOSIS — L299 Pruritus, unspecified: Secondary | ICD-10-CM | POA: Diagnosis not present

## 2020-08-21 ENCOUNTER — Emergency Department: Payer: Medicare (Managed Care)

## 2020-08-21 ENCOUNTER — Emergency Department
Admission: EM | Admit: 2020-08-21 | Discharge: 2020-08-21 | Disposition: A | Discharge status: Home / Self Care | Payer: Medicare (Managed Care) | Attending: Emergency Medicine | Admitting: Emergency Medicine

## 2020-08-21 ENCOUNTER — Other Ambulatory Visit: Payer: Self-pay

## 2020-08-21 DIAGNOSIS — Z79899 Other long term (current) drug therapy: Secondary | ICD-10-CM | POA: Diagnosis not present

## 2020-08-21 DIAGNOSIS — R059 Cough, unspecified: Secondary | ICD-10-CM | POA: Diagnosis present

## 2020-08-21 DIAGNOSIS — U071 COVID-19: Secondary | ICD-10-CM | POA: Insufficient documentation

## 2020-08-21 DIAGNOSIS — Z21 Asymptomatic human immunodeficiency virus [HIV] infection status: Secondary | ICD-10-CM | POA: Insufficient documentation

## 2020-08-21 LAB — COMPREHENSIVE METABOLIC PANEL
ALT: 23 U/L (ref 0–44)
AST: 19 U/L (ref 15–41)
Albumin: 4 g/dL (ref 3.5–5.0)
Alkaline Phosphatase: 89 U/L (ref 38–126)
Anion gap: 4 — ABNORMAL LOW (ref 5–15)
BUN: 14 mg/dL (ref 6–20)
CO2: 28 mmol/L (ref 22–32)
Calcium: 8.5 mg/dL — ABNORMAL LOW (ref 8.9–10.3)
Chloride: 105 mmol/L (ref 98–111)
Creatinine, Ser: 1.19 mg/dL — ABNORMAL HIGH (ref 0.44–1.00)
GFR, Estimated: 54 mL/min — ABNORMAL LOW (ref >60)
Glucose, Bld: 91 mg/dL (ref 70–99)
Potassium: 3.7 mmol/L (ref 3.5–5.1)
Sodium: 137 mmol/L (ref 135–145)
Total Bilirubin: 0.6 mg/dL (ref 0.3–1.2)
Total Protein: 8.4 g/dL — ABNORMAL HIGH (ref 6.5–8.1)

## 2020-08-21 LAB — CBC WITH DIFFERENTIAL/PLATELET
Abs Immature Granulocytes: 0.03 10*3/uL (ref 0.00–0.07)
Basophils Absolute: 0 10*3/uL (ref 0.0–0.1)
Basophils Relative: 0 %
Eosinophils Absolute: 0.1 10*3/uL (ref 0.0–0.5)
Eosinophils Relative: 1 %
HCT: 39.5 % (ref 36.0–46.0)
Hemoglobin: 13 g/dL (ref 12.0–15.0)
Immature Granulocytes: 0 %
Lymphocytes Relative: 22 %
Lymphs Abs: 2.1 10*3/uL (ref 0.7–4.0)
MCH: 32.1 pg (ref 26.0–34.0)
MCHC: 32.9 g/dL (ref 30.0–36.0)
MCV: 97.5 fL (ref 80.0–100.0)
Monocytes Absolute: 0.7 10*3/uL (ref 0.1–1.0)
Monocytes Relative: 7 %
Neutro Abs: 6.8 10*3/uL (ref 1.7–7.7)
Neutrophils Relative %: 70 %
Platelets: 331 10*3/uL (ref 150–400)
RBC: 4.05 MIL/uL (ref 3.87–5.11)
RDW: 12.1 % (ref 11.5–15.5)
WBC: 9.7 10*3/uL (ref 4.0–10.5)
nRBC: 0 % (ref 0.0–0.2)

## 2020-08-21 LAB — TROPONIN I (HIGH SENSITIVITY): Troponin I (High Sensitivity): 6 ng/L (ref <18)

## 2020-08-21 MED ORDER — PSEUDOEPH-BROMPHEN-DM 30-2-10 MG/5ML PO SYRP: 10.0000 mL | ORAL_SOLUTION | Freq: Four times a day (QID) | ORAL | 0 refills | Status: AC | PRN

## 2020-08-21 MED ORDER — METHYLPREDNISOLONE SODIUM SUCC 125 MG IJ SOLR
125.0000 mg | Freq: Once | INTRAMUSCULAR | Status: AC
  Administered 2020-08-21: 125 mg via INTRAMUSCULAR
  Filled 2020-08-21: qty 2

## 2020-08-21 MED ORDER — IPRATROPIUM-ALBUTEROL 0.5-2.5 (3) MG/3ML IN SOLN
3.0000 mL | Freq: Once | RESPIRATORY_TRACT | Status: AC
  Administered 2020-08-21: 3 mL via RESPIRATORY_TRACT
  Filled 2020-08-21: qty 3

## 2020-08-21 MED ORDER — PREDNISONE 50 MG PO TABS: 50.0000 mg | ORAL_TABLET | Freq: Every day | ORAL | 0 refills | Status: AC

## 2020-08-21 MED ORDER — ALBUTEROL SULFATE HFA 108 (90 BASE) MCG/ACT IN AERS: 2.0000 | INHALATION_SPRAY | Freq: Four times a day (QID) | RESPIRATORY_TRACT | 2 refills | Status: AC | PRN

## 2020-08-21 MED ORDER — BENZONATATE 100 MG PO CAPS: 100.0000 mg | ORAL_CAPSULE | Freq: Three times a day (TID) | ORAL | 0 refills | Status: AC | PRN

## 2020-08-21 NOTE — ED Triage Notes (Signed)
Pt comes ems with covid exposure. No symptoms.

## 2020-08-21 NOTE — ED Notes (Signed)
Patient transported to X-ray 

## 2020-08-21 NOTE — ED Provider Notes (Signed)
[facility] Emergency Department Provider Note  ____________________________________________  Time seen: Approximately 4:53 PM  I have reviewed the triage vital signs and the nursing notes.   HISTORY  Chief Complaint Covid Exposure    HPI Caitlin Mcbride is a 55 y.o. female who presents the emergency department complaining of cough, chest tightness, shortness of breath.  Patient states that she took care of her granddaughter who tested positive for COVID and she is concerned that she has the same.  Patient denies any fevers or chills, nasal congestion, difficulty breathing, abdominal pain, nausea vomiting, diarrhea or constipation.  No medications prior to arrival.  Medical history as described below which includes HIV.       Past Medical History:  Diagnosis Date   Depression    HIV (human immunodeficiency virus infection) (HCC)    Reflux     There are no problems to display for this patient.   Past Surgical History:  Procedure Laterality Date   ABDOMINAL HYSTERECTOMY      Prior to Admission medications   Medication Sig Start Date End Date Taking? Authorizing Provider  albuterol (VENTOLIN HFA) 108 (90 Base) MCG/ACT inhaler Inhale 2 puffs into the lungs every 6 (six) hours as needed for wheezing or shortness of breath. 08/21/20  Yes Larnie Heart, Delorise Royals, PA-C  benzonatate (TESSALON PERLES) 100 MG capsule Take 1 capsule (100 mg total) by mouth 3 (three) times daily as needed for cough. 08/21/20 08/21/21 Yes Clois Montavon, Delorise Royals, PA-C  brompheniramine-pseudoephedrine-DM 30-2-10 MG/5ML syrup Take 10 mLs by mouth 4 (four) times daily as needed. 08/21/20  Yes Tylar Merendino, Delorise Royals, PA-C  predniSONE (DELTASONE) 50 MG tablet Take 1 tablet (50 mg total) by mouth daily with breakfast. 08/21/20  Yes Nirvana Blanchett, Delorise Royals, PA-C  alum & mag hydroxide-simeth (MAALOX ADVANCED MAX ST) 400-400-40 MG/5ML suspension Take 5 mLs by mouth every 6 (six) hours as needed for  indigestion. Patient not taking: Reported on 05/31/2015 08/24/14   Minna Antis, MD  cetirizine (ZYRTEC) 10 MG tablet Take 10 mg by mouth daily.    [provider]  GENVOYA 150-150-200-10 MG TABS tablet Take 1 tablet by mouth daily.    [provider]  guaiFENesin-codeine 100-10 MG/5ML syrup Take 10 mLs by mouth every 4 (four) hours as needed for cough. Patient not taking: Reported on 05/31/2015 12/07/14   Evangeline Dakin, PA-C  LORazepam (ATIVAN) 1 MG tablet Take 1 mg by mouth every 8 (eight) hours as needed for anxiety.    [provider]  ondansetron (ZOFRAN) 4 MG tablet Take 1-2 tabs by mouth every 8 hours as needed for nausea/vomiting 05/31/15   Loleta Rose, MD  PREZISTA 800 MG tablet Take 800 mg by mouth daily.    [provider]  PROAIR HFA 108 [phone] Base) MCG/ACT inhaler Inhale 2 puffs into the lungs every 6 (six) hours as needed.    [provider]  traMADol (ULTRAM) 50 MG tablet Take 1 tablet (50 mg total) by mouth every 6 (six) hours as needed for moderate pain. Patient not taking: Reported on 05/31/2015 01/11/15   Joni Reining, PA-C    Allergies Patient has no known allergies.  History reviewed. No pertinent family history.  Social History Social History   Tobacco Use   Smoking status: Never   Smokeless tobacco: Never  Substance Use Topics   Alcohol use: No   Drug use: No     Review of Systems  Constitutional: No fever/chills Eyes: No visual changes.  No discharge ENT: Positive for nasal congestion Cardiovascular: no substernal chest pain.  Positive for chest tightness Respiratory: n positive cough. No SOB. Gastrointestinal: No abdominal pain.  No nausea, no vomiting.  No diarrhea.  No constipation. Musculoskeletal: Negative for musculoskeletal pain. Skin: Negative for rash, abrasions, lacerations, ecchymosis. Neurological: Negative for headaches, focal weakness or numbness.  10 System ROS otherwise  negative.  ____________________________________________   PHYSICAL EXAM:  VITAL SIGNS: ED Triage Vitals  Enc Vitals Group     BP 08/21/20 1509 111/78     Pulse Rate 08/21/20 1509 78     Resp 08/21/20 1509 18     Temp 08/21/20 1509 98.9 F (37.2 C)     Temp Source 08/21/20 1509 Oral     SpO2 08/21/20 1509 97 %     Weight 08/21/20 1508 208 lb (94.3 kg)     Height 08/21/20 1508 5\' 3"  (1.6 m)     Head Circumference --      Peak Flow --      Pain Score 08/21/20 1508 0     Pain Loc --      Pain Edu? --      Excl. in GC? --      Constitutional: Alert and oriented. Well appearing and in no acute distress. Eyes: Conjunctivae are normal. PERRL. EOMI. Head: Atraumatic. ENT:      Ears:       Nose: No congestion/rhinnorhea.      Mouth/Throat: Mucous membranes are moist.  Neck: No stridor.  Neck is supple full range of motion Hematological/Lymphatic/Immunilogical: No cervical lymphadenopathy. Cardiovascular: Normal rate, regular rhythm. Normal S1 and S2.  Good peripheral circulation. Respiratory: Normal respiratory effort without tachypnea or retractions. Lungs CTAB. Good air entry to the bases with no decreased or absent breath sounds. Musculoskeletal: Full range of motion to all extremities. No gross deformities appreciated. Neurologic:  Normal speech and language. No gross focal neurologic deficits are appreciated.  Skin:  Skin is warm, dry and intact. No rash noted. Psychiatric: Mood and affect are normal. Speech and behavior are normal. Patient exhibits appropriate insight and judgement.   ____________________________________________   LABS (all labs ordered are listed, but only abnormal results are displayed)  Labs Reviewed  RESP PANEL BY RT-PCR (FLU A&B, COVID) ARPGX2 - Abnormal; Notable for the following components:      Result Value   SARS Coronavirus 2 by RT PCR POSITIVE (*)    All other components within normal limits  COMPREHENSIVE METABOLIC PANEL - Abnormal;  Notable for the following components:   Creatinine, Ser 1.19 (*)    Calcium 8.5 (*)    Total Protein 8.4 (*)    GFR, Estimated 54 (*)    Anion gap 4 (*)    All other components within normal limits  CBC WITH DIFFERENTIAL/PLATELET  TROPONIN I (HIGH SENSITIVITY)   ____________________________________________  EKG  ED ECG REPORT I, 08/23/20 Ardell Aaronson,  personally viewed and interpreted this ECG.   Date: 08/21/2020  EKG Time: 1751 hrs.  Rate: 76 bpm  Rhythm: unchanged from previous tracings, normal sinus rhythm, no significant changes from EKG from 11/07/2016  Axis: Rightward axis  Intervals:none  ST&T Change: No ST elevation or depression noted  No STEMI.  Normal sinus rhythm.  No significant change from previous EKG.   ____________________________________________  RADIOLOGY I personally viewed and evaluated these images as part of my medical decision making, as well as reviewing the written report by the radiologist.  ED Provider Interpretation: Findings on  chest x-ray favors atelectasis versus effusion/consolidation concerning for pneumonia  DG Chest 2 View  Result Date: 08/21/2020 CLINICAL DATA:  Shortness of breath. EXAM: CHEST - 2 VIEW COMPARISON:  January 11, 2015 FINDINGS: No pneumothorax. The heart, hila, and mediastinum are normal. Mild haziness in the lateral left lung base identified. No other acute abnormalities. IMPRESSION: The mild haziness over the lateral left base is favored represent atelectasis or overlapping soft tissues. Developing early infiltrate considered less likely. Electronically Signed   By: Gerome Sam III M.D.   On: 08/21/2020 17:56    ____________________________________________    PROCEDURES  Procedure(s) performed:    Procedures    Medications  methylPREDNISolone sodium succinate (SOLU-MEDROL) 125 mg/2 mL injection 125 mg (125 mg Intramuscular Given 08/21/20 1803)  ipratropium-albuterol (DUONEB) 0.5-2.5 (3) MG/3ML nebulizer  solution 3 mL (3 mLs Nebulization Given 08/21/20 1803)     ____________________________________________   INITIAL IMPRESSION / ASSESSMENT AND PLAN / ED COURSE  Pertinent labs & imaging results that were available during my care of the patient were reviewed by me and considered in my medical decision making (see chart for details).  Review of the [place] CSRS was performed in accordance of the [facility] prior to dispensing any controlled drugs.           Patient's diagnosis is consistent with COVID-19.  Patient presented to the emergency department chest tightness, shortness of breath, cough, nasal congestion.  Patient had a close contact with her granddaughter who just tested positive for COVID.  Symptoms were consistent with COVID but given her history and complaints of chest tightness and shortness of breath patient was evaluated with labs and imaging.  This is reassuring at this time.  Patient did test positive for COVID and will be treated with prednisone, albuterol, cough medications for symptom relief.  Return precautions discussed with the patient.  Otherwise follow-up primary care as needed..  Patient is given ED precautions to return to the ED for any worsening or new symptoms.     ____________________________________________  FINAL CLINICAL IMPRESSION(S) / ED DIAGNOSES  Final diagnoses:  COVID-19      NEW MEDICATIONS STARTED DURING THIS VISIT:  ED Discharge Orders          Ordered    predniSONE (DELTASONE) 50 MG tablet  Daily with breakfast        08/21/20 1948    benzonatate (TESSALON PERLES) 100 MG capsule  3 times daily PRN        08/21/20 1948    albuterol (VENTOLIN HFA) 108 (90 Base) MCG/ACT inhaler  Every 6 hours PRN        08/21/20 1948    brompheniramine-pseudoephedrine-DM 30-2-10 MG/5ML syrup  4 times daily PRN        08/21/20 1948                This chart was dictated using voice recognition software/Dragon. Despite best efforts to proofread, errors  can occur which can change the meaning. Any change was purely unintentional.    Racheal Patches, PA-C 08/21/20 1952    Chesley Noon, MD 08/23/20 [phone]

## 2020-08-22 LAB — RESP PANEL BY RT-PCR (FLU A&B, COVID) ARPGX2
Influenza A by PCR: NEGATIVE
Influenza B by PCR: NEGATIVE
SARS Coronavirus 2 by RT PCR: POSITIVE — AB

## 2020-12-16 DIAGNOSIS — J301 Allergic rhinitis due to pollen: Secondary | ICD-10-CM | POA: Diagnosis not present

## 2020-12-16 DIAGNOSIS — L819 Disorder of pigmentation, unspecified: Secondary | ICD-10-CM | POA: Diagnosis not present

## 2020-12-23 DIAGNOSIS — H40053 Ocular hypertension, bilateral: Secondary | ICD-10-CM | POA: Diagnosis not present

## 2021-03-07 DIAGNOSIS — Z79899 Other long term (current) drug therapy: Secondary | ICD-10-CM | POA: Diagnosis not present

## 2021-03-07 DIAGNOSIS — R7989 Other specified abnormal findings of blood chemistry: Secondary | ICD-10-CM | POA: Diagnosis not present

## 2021-03-07 DIAGNOSIS — N182 Chronic kidney disease, stage 2 (mild): Secondary | ICD-10-CM | POA: Diagnosis not present

## 2021-03-07 DIAGNOSIS — K219 Gastro-esophageal reflux disease without esophagitis: Secondary | ICD-10-CM | POA: Diagnosis not present

## 2021-03-07 DIAGNOSIS — M79605 Pain in left leg: Secondary | ICD-10-CM | POA: Diagnosis not present

## 2021-03-07 DIAGNOSIS — B372 Candidiasis of skin and nail: Secondary | ICD-10-CM | POA: Diagnosis not present

## 2021-03-07 DIAGNOSIS — M79604 Pain in right leg: Secondary | ICD-10-CM | POA: Diagnosis not present

## 2021-03-07 DIAGNOSIS — E273 Drug-induced adrenocortical insufficiency: Secondary | ICD-10-CM | POA: Diagnosis not present

## 2021-03-07 DIAGNOSIS — L989 Disorder of the skin and subcutaneous tissue, unspecified: Secondary | ICD-10-CM | POA: Diagnosis not present

## 2021-04-11 DIAGNOSIS — L986 Other infiltrative disorders of the skin and subcutaneous tissue: Secondary | ICD-10-CM | POA: Diagnosis not present

## 2021-04-11 DIAGNOSIS — H269 Unspecified cataract: Secondary | ICD-10-CM | POA: Diagnosis not present

## 2021-04-11 DIAGNOSIS — Z79899 Other long term (current) drug therapy: Secondary | ICD-10-CM | POA: Diagnosis not present

## 2021-04-11 DIAGNOSIS — R21 Rash and other nonspecific skin eruption: Secondary | ICD-10-CM | POA: Diagnosis not present

## 2021-04-11 DIAGNOSIS — J4520 Mild intermittent asthma, uncomplicated: Secondary | ICD-10-CM | POA: Diagnosis not present

## 2021-04-11 DIAGNOSIS — J302 Other seasonal allergic rhinitis: Secondary | ICD-10-CM | POA: Diagnosis not present

## 2021-04-11 DIAGNOSIS — F32A Depression, unspecified: Secondary | ICD-10-CM | POA: Diagnosis not present

## 2021-04-11 DIAGNOSIS — L989 Disorder of the skin and subcutaneous tissue, unspecified: Secondary | ICD-10-CM | POA: Diagnosis not present

## 2021-04-11 DIAGNOSIS — Z4802 Encounter for removal of sutures: Secondary | ICD-10-CM | POA: Diagnosis not present

## 2021-04-11 DIAGNOSIS — L905 Scar conditions and fibrosis of skin: Secondary | ICD-10-CM | POA: Diagnosis not present

## 2021-04-11 DIAGNOSIS — N183 Chronic kidney disease, stage 3 unspecified: Secondary | ICD-10-CM | POA: Diagnosis not present

## 2021-04-29 DIAGNOSIS — H40031 Anatomical narrow angle, right eye: Secondary | ICD-10-CM | POA: Diagnosis not present

## 2021-04-29 DIAGNOSIS — H401131 Primary open-angle glaucoma, bilateral, mild stage: Secondary | ICD-10-CM | POA: Diagnosis not present

## 2021-05-13 DIAGNOSIS — H401131 Primary open-angle glaucoma, bilateral, mild stage: Secondary | ICD-10-CM | POA: Diagnosis not present

## 2021-05-27 DIAGNOSIS — H401131 Primary open-angle glaucoma, bilateral, mild stage: Secondary | ICD-10-CM | POA: Diagnosis not present

## 2021-06-03 DIAGNOSIS — E242 Drug-induced Cushing's syndrome: Secondary | ICD-10-CM | POA: Diagnosis not present

## 2021-06-03 DIAGNOSIS — R7989 Other specified abnormal findings of blood chemistry: Secondary | ICD-10-CM | POA: Diagnosis not present

## 2021-06-20 DIAGNOSIS — H40053 Ocular hypertension, bilateral: Secondary | ICD-10-CM | POA: Diagnosis not present

## 2021-06-22 DIAGNOSIS — R7989 Other specified abnormal findings of blood chemistry: Secondary | ICD-10-CM | POA: Diagnosis not present

## 2021-06-24 DIAGNOSIS — J301 Allergic rhinitis due to pollen: Secondary | ICD-10-CM | POA: Diagnosis not present

## 2021-08-24 DIAGNOSIS — E242 Drug-induced Cushing's syndrome: Secondary | ICD-10-CM | POA: Diagnosis not present

## 2021-09-26 DIAGNOSIS — N183 Chronic kidney disease, stage 3 unspecified: Secondary | ICD-10-CM | POA: Diagnosis not present

## 2021-09-28 DIAGNOSIS — Z1231 Encounter for screening mammogram for malignant neoplasm of breast: Secondary | ICD-10-CM | POA: Diagnosis not present

## 2021-09-29 DIAGNOSIS — H401131 Primary open-angle glaucoma, bilateral, mild stage: Secondary | ICD-10-CM | POA: Diagnosis not present

## 2021-10-03 DIAGNOSIS — E782 Mixed hyperlipidemia: Secondary | ICD-10-CM | POA: Diagnosis not present

## 2021-10-03 DIAGNOSIS — R799 Abnormal finding of blood chemistry, unspecified: Secondary | ICD-10-CM | POA: Diagnosis not present

## 2021-10-03 DIAGNOSIS — Z13220 Encounter for screening for lipoid disorders: Secondary | ICD-10-CM | POA: Diagnosis not present

## 2021-10-03 DIAGNOSIS — Z9189 Other specified personal risk factors, not elsewhere classified: Secondary | ICD-10-CM | POA: Diagnosis not present

## 2021-10-03 DIAGNOSIS — Z23 Encounter for immunization: Secondary | ICD-10-CM | POA: Diagnosis not present

## 2021-10-03 DIAGNOSIS — L309 Dermatitis, unspecified: Secondary | ICD-10-CM | POA: Diagnosis not present

## 2021-10-03 DIAGNOSIS — R7989 Other specified abnormal findings of blood chemistry: Secondary | ICD-10-CM | POA: Diagnosis not present

## 2021-10-14 DIAGNOSIS — H401131 Primary open-angle glaucoma, bilateral, mild stage: Secondary | ICD-10-CM | POA: Diagnosis not present

## 2021-12-23 DIAGNOSIS — J301 Allergic rhinitis due to pollen: Secondary | ICD-10-CM | POA: Diagnosis not present

## 2022-02-06 DIAGNOSIS — Z23 Encounter for immunization: Secondary | ICD-10-CM | POA: Diagnosis not present

## 2022-02-06 DIAGNOSIS — B372 Candidiasis of skin and nail: Secondary | ICD-10-CM | POA: Diagnosis not present

## 2022-02-06 DIAGNOSIS — E782 Mixed hyperlipidemia: Secondary | ICD-10-CM | POA: Diagnosis not present

## 2022-02-06 DIAGNOSIS — M79604 Pain in right leg: Secondary | ICD-10-CM | POA: Diagnosis not present

## 2022-02-06 DIAGNOSIS — L853 Xerosis cutis: Secondary | ICD-10-CM | POA: Diagnosis not present

## 2022-02-06 DIAGNOSIS — M79605 Pain in left leg: Secondary | ICD-10-CM | POA: Diagnosis not present

## 2022-02-06 DIAGNOSIS — L309 Dermatitis, unspecified: Secondary | ICD-10-CM | POA: Diagnosis not present

## 2022-02-06 DIAGNOSIS — H269 Unspecified cataract: Secondary | ICD-10-CM | POA: Diagnosis not present

## 2022-02-10 ENCOUNTER — Other Ambulatory Visit: Payer: Self-pay

## 2022-02-10 ENCOUNTER — Emergency Department: Payer: 59

## 2022-02-10 ENCOUNTER — Emergency Department
Admission: EM | Admit: 2022-02-10 | Discharge: 2022-02-10 | Disposition: A | Discharge status: Home / Self Care | Payer: 59 | Attending: Emergency Medicine | Admitting: Emergency Medicine

## 2022-02-10 DIAGNOSIS — X500XXA Overexertion from strenuous movement or load, initial encounter: Secondary | ICD-10-CM | POA: Insufficient documentation

## 2022-02-10 DIAGNOSIS — Z21 Asymptomatic human immunodeficiency virus [HIV] infection status: Secondary | ICD-10-CM | POA: Diagnosis not present

## 2022-02-10 DIAGNOSIS — S92514A Nondisplaced fracture of proximal phalanx of right lesser toe(s), initial encounter for closed fracture: Secondary | ICD-10-CM | POA: Diagnosis not present

## 2022-02-10 DIAGNOSIS — S99921A Unspecified injury of right foot, initial encounter: Secondary | ICD-10-CM | POA: Diagnosis present

## 2022-02-10 DIAGNOSIS — Y9389 Activity, other specified: Secondary | ICD-10-CM | POA: Insufficient documentation

## 2022-02-10 DIAGNOSIS — S92511A Displaced fracture of proximal phalanx of right lesser toe(s), initial encounter for closed fracture: Secondary | ICD-10-CM | POA: Diagnosis not present

## 2022-02-10 MED ORDER — OXYCODONE HCL 5 MG PO TABS: 5.0000 mg | ORAL_TABLET | Freq: Three times a day (TID) | ORAL | 0 refills | Status: AC | PRN

## 2022-02-10 MED ORDER — ACETAMINOPHEN 500 MG PO TABS: 1000.0000 mg | ORAL_TABLET | Freq: Four times a day (QID) | ORAL | 2 refills | Status: AC | PRN

## 2022-02-10 NOTE — ED Provider Notes (Signed)
[facility] Provider Note    None    (approximate)   History   Chief Complaint Toe Pain (Right)   HPI Caitlin Mcbride is a 57 y.o. female, history of HIV, depression, acid reflux, presents to the emergency department for evaluation of toe pain.  She states that she was hanging a curtain when her foot turn inward, causing her significant pain in her fifth toe.  This occurred earlier this morning.  Since then, she has had significant difficulty walking.  Denies any additional injuries.  Denies fever/chills, paresthesias, cold sensation, ankle pain, leg pain, chest pain, shortness of breath, dizziness/lightheadedness, or nausea/vomiting.  History Limitations: No limitations.        Physical Exam  Triage Vital Signs: ED Triage Vitals  Enc Vitals Group     BP 02/10/22 1458 112/71     Pulse Rate 02/10/22 1458 75     Resp 02/10/22 1458 18     Temp 02/10/22 1458 98.4 F (36.9 C)     Temp Source 02/10/22 1458 Oral     SpO2 02/10/22 1458 98 %     Weight --      Height --      Head Circumference --      Peak Flow --      Pain Score 02/10/22 1450 6     Pain Loc --      Pain Edu? --      Excl. in [place]? --     Most recent vital signs: Vitals:   02/10/22 1458  BP: 112/71  Pulse: 75  Resp: 18  Temp: 98.4 F (36.9 C)  SpO2: 98%    General: Awake, NAD.  Skin: Warm, dry. No rashes or lesions.  Eyes: PERRL. Conjunctivae normal.  CV: Good peripheral perfusion.  Resp: Normal effort.  Abd: Soft, non-tender. No distention.  Neuro: At baseline. No gross neurological deficits.  Musculoskeletal: Normal ROM of all extremities.  Focused Exam: Tenderness appreciated along the proximal phalanx of the fifth digit.  No obvious deformities.  No overlying erythema or warmth.  No tenderness along the metatarsals or other digits.  Normal range of motion at the ankle joint.  Normal cap refill in all digits.  Physical Exam    ED Results / Procedures / Treatments   Labs (all labs ordered are listed, but only abnormal results are displayed) Labs Reviewed - No data to display   EKG N/A.    RADIOLOGY  ED Provider Interpretation: I personally reviewed and interpreted this x-ray, comminuted fractures to the mid segment and the base of the small toe proximal phalanx.  DG Foot Complete Right  Result Date: 02/10/2022 CLINICAL DATA:  Injury EXAM: RIGHT FOOT COMPLETE - 3+ VIEW COMPARISON:  None Available. FINDINGS: Is a likely comminuted fractures through the mid segment and the base of the left fifth proximal phalanx. No radiopaque foreign body. Fracture sites do not definitively extend into the MTP joint. Bidirectional calcaneal enthesophytes. IMPRESSION: Comminuted fractures through the mid segment and the base of the small toe proximal phalanx. Electronically Signed   By: Marin Roberts M.D.   On: 02/10/2022 15:16    PROCEDURES:  Critical Care performed: N/A.  Marland KitchenSplint Application  Date/Time: 02/10/2022 4:39 PM  Performed by: Teodoro Spray, PA Authorized by: Teodoro Spray, PA   Consent:    Consent obtained:  Verbal   Consent given by:  Patient   Risks, benefits, and alternatives were discussed: yes     Risks  discussed:  Discoloration, numbness and pain   Alternatives discussed:  No treatment Pre-procedure details:    Distal neurologic exam:  Normal   Distal perfusion: brisk capillary refill   Procedure details:    Location:  Toe   Toe location:  R little toe   Strapping: yes   Post-procedure details:    Distal perfusion: brisk capillary refill     Procedure completion:  Tolerated well, no immediate complications     MEDICATIONS ORDERED IN ED: Medications - No data to display   IMPRESSION / MDM / [place] / ED COURSE  I reviewed the triage vital signs and the nursing notes.                              Differential diagnosis includes, but is not limited to, phalangeal fracture, dislocation, cellulitis,  nail plate injury, metatarsal fracture, ankle sprain.   Assessment/Plan Patient presents with fifth toe pain following foot inversion that occurred earlier today while she was hanging curtains.  She does have some tenderness on physical exam, no obvious deformities though and no signs of infection.  Her x-ray does show a comminuted fracture through the mid segment and base of the small toe proximal phalanx.  No significant displacement.  No injury to the metatarsals or other digits.  Placed her in a buddy splint and postop shoe.  Provide her with a brief prescription for oxycodone to be utilized as needed.  Provided with a referral to podiatry for follow-up.  She was amenable to this.  Will discharge.  Provided the patient with anticipatory guidance, return precautions, and educational material. Encouraged the patient to return to the emergency department at any time if they begin to experience any new or worsening symptoms. Patient expressed understanding and agreed with the plan.   Patient's presentation is most consistent with acute complicated illness / injury requiring diagnostic workup.       FINAL CLINICAL IMPRESSION(S) / ED DIAGNOSES   Final diagnoses:  Closed nondisplaced fracture of proximal phalanx of lesser toe of right foot, initial encounter     Rx / DC Orders   ED Discharge Orders          Ordered    oxyCODONE (ROXICODONE) 5 MG immediate release tablet  Every 8 hours PRN        02/10/22 1549    acetaminophen (TYLENOL) 500 MG tablet  Every 6 hours PRN        02/10/22 1549             Note:  This document was prepared using Dragon voice recognition software and may include unintentional dictation errors.   Teodoro Spray, [place] 02/10/22 1640    Nance Pear, MD 02/10/22 [phone]

## 2022-02-10 NOTE — ED Triage Notes (Signed)
Pt to ED via POV from home. Pt ambulatory into ER. Pt reports she was hanging a curtain and her foot turned wrong and is now having right toe pain .

## 2022-02-10 NOTE — Discharge Instructions (Addendum)
-  Your x-ray shows that you have a fracture along your fifth toe.  Until he can be seen by podiatry, please continue buddy taping your toe to the fourth toe so that it can function as a splint.  In addition, please utilize the hard shoe whenever walking.  -You may take Tylenol as needed for the pain.  You may utilize oxycodone as needed, the use caution as may make you dizzy/drowsy.  -Please follow-up with podiatry for reevaluation.  You may call the number listed on this page.  -Return to the emergency department anytime if you begin to experience any new or worsening symptoms.

## 2022-02-17 DIAGNOSIS — S92514A Nondisplaced fracture of proximal phalanx of right lesser toe(s), initial encounter for closed fracture: Secondary | ICD-10-CM | POA: Diagnosis not present

## 2022-02-20 DIAGNOSIS — Z79899 Other long term (current) drug therapy: Secondary | ICD-10-CM | POA: Diagnosis not present

## 2022-02-20 DIAGNOSIS — N183 Chronic kidney disease, stage 3 unspecified: Secondary | ICD-10-CM | POA: Diagnosis not present

## 2022-02-20 DIAGNOSIS — F32A Depression, unspecified: Secondary | ICD-10-CM | POA: Diagnosis not present

## 2022-04-19 DIAGNOSIS — H401132 Primary open-angle glaucoma, bilateral, moderate stage: Secondary | ICD-10-CM | POA: Diagnosis not present

## 2022-04-20 DIAGNOSIS — H5213 Myopia, bilateral: Secondary | ICD-10-CM | POA: Diagnosis not present

## 2022-05-25 DIAGNOSIS — H401131 Primary open-angle glaucoma, bilateral, mild stage: Secondary | ICD-10-CM | POA: Diagnosis not present

## 2022-05-31 DIAGNOSIS — H524 Presbyopia: Secondary | ICD-10-CM | POA: Diagnosis not present

## 2022-06-19 DIAGNOSIS — H401131 Primary open-angle glaucoma, bilateral, mild stage: Secondary | ICD-10-CM | POA: Diagnosis not present

## 2022-06-23 DIAGNOSIS — L299 Pruritus, unspecified: Secondary | ICD-10-CM | POA: Diagnosis not present

## 2022-06-23 DIAGNOSIS — J301 Allergic rhinitis due to pollen: Secondary | ICD-10-CM | POA: Diagnosis not present

## 2022-07-17 DIAGNOSIS — B372 Candidiasis of skin and nail: Secondary | ICD-10-CM | POA: Diagnosis not present

## 2022-07-17 DIAGNOSIS — L853 Xerosis cutis: Secondary | ICD-10-CM | POA: Diagnosis not present

## 2022-07-17 DIAGNOSIS — E782 Mixed hyperlipidemia: Secondary | ICD-10-CM | POA: Diagnosis not present

## 2022-12-31 ENCOUNTER — Encounter: Payer: Self-pay | Admitting: Emergency Medicine

## 2022-12-31 ENCOUNTER — Emergency Department: Payer: 59

## 2022-12-31 ENCOUNTER — Other Ambulatory Visit: Payer: Self-pay

## 2022-12-31 ENCOUNTER — Emergency Department
Admission: EM | Admit: 2022-12-31 | Discharge: 2022-12-31 | Disposition: A | Discharge status: Home / Self Care | Payer: 59 | Attending: Emergency Medicine | Admitting: Emergency Medicine

## 2022-12-31 DIAGNOSIS — W01198A Fall on same level from slipping, tripping and stumbling with subsequent striking against other object, initial encounter: Secondary | ICD-10-CM | POA: Insufficient documentation

## 2022-12-31 DIAGNOSIS — Y9222 Religious institution as the place of occurrence of the external cause: Secondary | ICD-10-CM | POA: Insufficient documentation

## 2022-12-31 DIAGNOSIS — S6992XA Unspecified injury of left wrist, hand and finger(s), initial encounter: Secondary | ICD-10-CM | POA: Insufficient documentation

## 2022-12-31 DIAGNOSIS — S0990XA Unspecified injury of head, initial encounter: Secondary | ICD-10-CM | POA: Diagnosis present

## 2022-12-31 DIAGNOSIS — S0101XA Laceration without foreign body of scalp, initial encounter: Secondary | ICD-10-CM | POA: Insufficient documentation

## 2022-12-31 DIAGNOSIS — W19XXXA Unspecified fall, initial encounter: Secondary | ICD-10-CM

## 2022-12-31 MED ORDER — LIDOCAINE HCL (PF) 1 % IJ SOLN
10.0000 mL | Freq: Once | INTRAMUSCULAR | Status: AC
  Administered 2022-12-31: 10 mL
  Filled 2022-12-31: qty 10

## 2022-12-31 MED ORDER — LIDOCAINE-EPINEPHRINE-TETRACAINE (LET) TOPICAL GEL
3.0000 mL | Freq: Once | TOPICAL | Status: AC
  Administered 2022-12-31: 3 mL via TOPICAL
  Filled 2022-12-31: qty 3

## 2022-12-31 MED ORDER — ACETAMINOPHEN 325 MG PO TABS
650.0000 mg | ORAL_TABLET | Freq: Once | ORAL | Status: AC
  Administered 2022-12-31: 650 mg via ORAL
  Filled 2022-12-31: qty 2

## 2022-12-31 NOTE — ED Notes (Addendum)
First Nurse Note: Pt [place] ED [facility] from [facility]. Pt tripped and fell. Pt was given fentanyl. Pt is in NAD. Pt has a laceration right forehead. Bleeding controlled at this time.

## 2022-12-31 NOTE — ED Provider Notes (Cosign Needed Addendum)
[place] EMERGENCY DEPARTMENT AT [facility] Provider Note   CSN: [id number] Arrival date & time: 12/31/22  1107     History  Chief Complaint  Patient presents with   Caitlin Mcbride    Caitlin Mcbride is a 57 y.o. female.  Patient here for fall.  She was involved in a play at church tripped on another person's foot fell hit head above right eye.  Positive bleeding no loss of consciousness not on blood thinners.  Also has pain and swelling of left wrist.  Denies any numbness weakness or visual changes.  No chest pain or shortness of breath at any point.   Fall Pertinent negatives include no chest pain, no abdominal pain and no shortness of breath.       Home Medications Prior to Admission medications   Medication Sig Start Date End Date Taking? Authorizing Provider  acetaminophen (TYLENOL) 500 MG tablet Take 2 tablets (1,000 mg total) by mouth every 6 (six) hours as needed. 02/10/22 02/10/23  Varney Daily, PA  albuterol (VENTOLIN HFA) 108 (90 Base) MCG/ACT inhaler Inhale 2 puffs into the lungs every 6 (six) hours as needed for wheezing or shortness of breath. 08/21/20   Cuthriell, Delorise Royals, PA-C  alum & mag hydroxide-simeth (MAALOX ADVANCED MAX ST) 400-400-40 MG/5ML suspension Take 5 mLs by mouth every 6 (six) hours as needed for indigestion. Patient not taking: Reported on 05/31/2015 08/24/14   Minna Antis, MD  brompheniramine-pseudoephedrine-DM 30-2-10 MG/5ML syrup Take 10 mLs by mouth 4 (four) times daily as needed. 08/21/20   Cuthriell, Delorise Royals, PA-C  cetirizine (ZYRTEC) 10 MG tablet Take 10 mg by mouth daily.    [provider]  GENVOYA 150-150-200-10 MG TABS tablet Take 1 tablet by mouth daily.    [provider]  guaiFENesin-codeine 100-10 MG/5ML syrup Take 10 mLs by mouth every 4 (four) hours as needed for cough. Patient not taking: Reported on 05/31/2015 12/07/14   Evangeline Dakin, PA-C  LORazepam (ATIVAN) 1 MG tablet Take 1 mg by mouth  every 8 (eight) hours as needed for anxiety.    [provider]  ondansetron (ZOFRAN) 4 MG tablet Take 1-2 tabs by mouth every 8 hours as needed for nausea/vomiting 05/31/15   Loleta Rose, MD  predniSONE (DELTASONE) 50 MG tablet Take 1 tablet (50 mg total) by mouth daily with breakfast. 08/21/20   Cuthriell, Delorise Royals, PA-C  PREZISTA 800 MG tablet Take 800 mg by mouth daily.    [provider]  PROAIR HFA 108 [phone] Base) MCG/ACT inhaler Inhale 2 puffs into the lungs every 6 (six) hours as needed.    [provider]      Allergies    Patient has no known allergies.    Review of Systems   Review of Systems  Constitutional:  Negative for chills and fever.  Eyes:  Negative for visual disturbance.  Respiratory:  Negative for shortness of breath.   Cardiovascular:  Negative for chest pain.  Gastrointestinal:  Negative for abdominal pain.  Genitourinary:  Negative for flank pain.  Musculoskeletal:  Positive for arthralgias.  Skin:  Positive for wound.  Neurological:  Negative for seizures, syncope, weakness and numbness.    Physical Exam Updated Vital Signs BP (!) 159/83   Pulse 66   Temp 98.4 F (36.9 C) (Oral)   Resp 18   Ht 5\' 3"  (1.6 m)   Wt 93.9 kg   SpO2 100%   BMI 36.67 kg/m  Physical Exam Vitals and  nursing note reviewed.  Constitutional:      General: She is not in acute distress.    Appearance: She is well-developed.  HENT:     Head: Normocephalic and atraumatic.  Eyes:     Conjunctiva/sclera: Conjunctivae normal.  Cardiovascular:     Rate and Rhythm: Normal rate and regular rhythm.     Heart sounds: No murmur heard. Pulmonary:     Effort: Pulmonary effort is normal. No respiratory distress.     Breath sounds: Normal breath sounds.  Abdominal:     Palpations: Abdomen is soft.     Tenderness: There is no abdominal tenderness.  Musculoskeletal:        General: Swelling present.     Cervical back: Neck supple.     Comments: Marked  swelling of left hand with some edema overlying anatomical snuffbox with localized tenderness.  Distal pulse and sensation intact.  F ROM.  Grip strength intact.  Skin:    General: Skin is warm and dry.     Capillary Refill: Capillary refill takes less than 2 seconds.     Comments: Laceration overlying right frontal, 2 cm.  Mild local bleeding. Right periorbital edema with no any evidence of ocular entrapment.  EOMI.  Neurological:     Mental Status: She is alert.  Psychiatric:        Mood and Affect: Mood normal.     ED Results / Procedures / Treatments   Labs (all labs ordered are listed, but only abnormal results are displayed) Labs Reviewed - No data to display  EKG None  Radiology DG Shoulder Left Result Date: 12/31/2022 CLINICAL DATA:  Left shoulder pain after a fall. EXAM: LEFT SHOULDER - 2+ VIEW COMPARISON:  None Available. FINDINGS: There is no evidence of fracture or dislocation. There is no evidence of arthropathy or other focal bone abnormality. Soft tissues are unremarkable. IMPRESSION: Negative. Electronically Signed   By: Kennith Center M.D.   On: 12/31/2022 14:56   CT Head Wo Contrast Result Date: 12/31/2022 CLINICAL DATA:  Head trauma.  Fall.  Hit head on table. EXAM: CT HEAD WITHOUT CONTRAST TECHNIQUE: Contiguous axial images were obtained from the base of the skull through the vertex without intravenous contrast. RADIATION DOSE REDUCTION: This exam was performed according to the departmental dose-optimization program which includes automated exposure control, adjustment of the mA and/or kV according to patient size and/or use of iterative reconstruction technique. COMPARISON:  11/06/2016 FINDINGS: Brain: No evidence of acute infarction, hemorrhage, hydrocephalus, extra-axial collection or mass lesion/mass effect. Vascular: No hyperdense vessel or unexpected calcification. Skull: Normal. Negative for fracture or focal lesion. Sinuses/Orbits: No acute finding. Other: Right  frontal scalp hematoma measuring 5.9 x 1.2 cm. This extends into the right periorbital region. IMPRESSION: 1. No acute intracranial abnormalities. 2. Right frontal scalp hematoma extending into the right periorbital region. No evidence for underlying skull fracture. Electronically Signed   By: Signa Kell M.D.   On: 12/31/2022 12:15   DG Hand Complete Left Result Date: 12/31/2022 CLINICAL DATA:  Fall, pain EXAM: LEFT HAND - COMPLETE 3+ VIEW COMPARISON:  None Available. FINDINGS: There is no evidence of fracture or dislocation. There is no evidence of arthropathy or other focal bone abnormality. Incidental note of ulnar minus variant of the wrist. Soft tissues are unremarkable. IMPRESSION: No fracture or dislocation of the left hand. Electronically Signed   By: Jearld Lesch M.D.   On: 12/31/2022 12:14   CT CERVICAL SPINE WO CONTRAST Result Date: 12/31/2022 CLINICAL  DATA:  Provided history: Trauma. EXAM: CT CERVICAL SPINE WITHOUT CONTRAST TECHNIQUE: Multidetector CT imaging of the cervical spine was performed without intravenous contrast. Multiplanar CT image reconstructions were also generated. RADIATION DOSE REDUCTION: This exam was performed according to the departmental dose-optimization program which includes automated exposure control, adjustment of the mA and/or kV according to patient size and/or use of iterative reconstruction technique. COMPARISON:  None. FINDINGS: Alignment: Nonspecific straightening of the expected cervical lordosis. Mild dextrocurvature of the cervical spine. Slight C3-C4 grade 1 anterolisthesis. Skull base and vertebrae: The basion-dental and atlanto-dental intervals are maintained.No evidence of acute fracture to the cervical spine. Soft tissues and spinal canal: No prevertebral fluid or swelling. No visible canal hematoma. Disc levels: Cervical spondylosis. Disc space narrowing is greatest at C5-C6 (moderate at this level). Multilevel disc bulges/central disc protrusions  and uncovertebral hypertrophy. No appreciable high-grade spinal canal stenosis. Uncovertebral hypertrophy results in bony neural foraminal narrowing on the right at C5-C6. C5-C6 ventral osteophyte Upper chest: No consolidation within the imaged lung apices. No visible pneumothorax. IMPRESSION: 1. No evidence of an acute cervical spine fracture. 2. Nonspecific straightening of the expected cervical lordosis. 3. Mild dextrocurvature of the cervical spine. 4. Slight C3-C4 grade 1 anterolisthesis. 5. Cervical spondylosis as described. Electronically Signed   By: Jackey Loge D.O.   On: 12/31/2022 12:03    Procedures .Laceration Repair  Date/Time: 12/31/2022 1:25 PM  Performed by: Christen Bame, PA-C Authorized by: Christen Bame, PA-C   Consent:    Consent obtained:  Verbal   Consent given by:  Patient   Risks, benefits, and alternatives were discussed: yes     Risks discussed:  Infection, need for additional repair and nerve damage   Alternatives discussed:  No treatment Universal protocol:    Site/side marked: yes     Immediately prior to procedure, a time out was called: yes     Patient identity confirmed:  Verbally with patient Anesthesia:    Anesthesia method:  Local infiltration and topical application   Topical anesthetic:  LET   Local anesthetic:  Lidocaine 1% WITH epi Laceration details:    Location:  Scalp   Scalp location:  Frontal   Length (cm):  2 Pre-procedure details:    Preparation:  Patient was prepped and draped in usual sterile fashion Exploration:    Hemostasis achieved with:  Direct pressure   Imaging outcome: foreign body not noted     Wound exploration: entire depth of wound visualized     Wound extent: areolar tissue not violated, fascia not violated, no nerve damage and no tendon damage     Contaminated: no   Treatment:    Area cleansed with:  Povidone-iodine   Undermining:  None Skin repair:    Repair method:  Sutures   Suture size:  4-0   Suture material:   Prolene   Suture technique:  Simple interrupted   Number of sutures:  4 Approximation:    Approximation:  Close Repair type:    Repair type:  Simple Post-procedure details:    Dressing:  Open (no dressing)   Procedure completion:  Tolerated     Medications Ordered in ED Medications  lidocaine (PF) (XYLOCAINE) 1 % injection 10 mL (has no administration in time range)  lidocaine-EPINEPHrine-tetracaine (LET) topical gel (3 mLs Topical Given 12/31/22 1244)    ED Course/ Medical Decision Making/ A&P  Medical Decision Making Patient here for mechanical fall tripped hit head earlier today.  Does have some swelling overlying the right eye without any evidence of ocular entrapment.  No LOC numbness weakness or neurodeficits.  CT shows overall unremarkable.  Will need suture closure and follow-up outpatient.  Left wrist does have marked tenderness and swelling over anatomic snuffbox region.  Negative x-ray neurovasculature intact I will recommend thumb spica splint follow-up with orthopedics concern for occult scaphoid fracture.  Tolerated 4 sutures well.  Recommended wound aftercare.  Follow-up with primary as well as Ortho.  Prior to discharge she did note some left shoulder pain.  She has mildly decreased range of motion diffuse tenderness.  Distal pulse and sensation intact will check x-ray.  She does note that this did not hurt previously.  X-ray shoulder was obtained which is negative.  She will follow-up with orthopedics.   Amount and/or Complexity of Data Reviewed Radiology: ordered.  Risk Prescription drug management.           Final Clinical Impression(s) / ED Diagnoses Final diagnoses:  Fall, initial encounter  Laceration of scalp, initial encounter  Injury of left wrist, initial encounter    Rx / DC Orders ED Discharge Orders     None         Christen Bame, PA-C 12/31/22 1327    Christen Bame, PA-C 12/31/22 1504     Minna Antis, MD 01/01/23 0800

## 2022-12-31 NOTE — Discharge Instructions (Signed)
Please cleanse wound with soap and warm water.  Apply topical antibiotic such as Neosporin.  Follow-up with primary care urgent care or here in 7 days for suture removal.  Please follow-up with orthopedics for further evaluation of wrist injury.  Return here for new or worse symptoms including numbness weakness.

## 2022-12-31 NOTE — ED Triage Notes (Signed)
Patient [place] ED [facility] from [facility] after a [place]. Patient tripped and fell and hit head on table. Laceration to right side of head- wrapped by EMS. Denies LOC. Also c/o left hand pain.  20 R hand placed by EMS and given fentanyl

## 2023-08-06 NOTE — Progress Notes (Signed)
 [facility] INFECTIOUS DISEASES CLINIC      INFECTIOUS DISEASES CLINIC [address] [place], [place]  [address] P [phone] F [phone]   Primary care provider: Selma Montie Massa, MD   REASON FOR VISIT: Routine HIV follow-up  Psychiatrist: Dr. Daniel Ripple, [place] ENT: Donia Farr, [facility]' Pulmonary: Dr. Avel   Subjective:   Caitlin Mcbride is a 58 y.o. female with well-contolled HIV, obesity, CKD, hx depression, seasonal allergies and chronic cough  HIV Hx. Diagnosed 1994. Hx poor adherence and dual drug treatment. CD4 nadir 9. Previously on Truvada, AZT, Raltegravir and DRV/r. Genotype 08/2005: 184V, 211K, 214F, 215Y, 41L, led to regimen of Truvada, DRV/r, raltegrevir bid and AZT bid. Stable ART since 2008, suppressed since 2012. - Cumulative Mutations:      PR Mutations: 69Y, 71T - no clinical significance      RT Mutations: 41L, 74V, 98G, 100I, 103N, 181C, 184V, 190A, 211K, 214F, 215Y, 225H, 386I - high level NRTI (except TDF) and NNRTI resistance  - Since 01/2015 on Genvoya / QD Darunavir 800mg , nonstandard regimen given mutations.  - enrolled on ACTG treatment study 2024, started bictegravir 75mg  + lenacaprivir 50mg  on 08/2022 - Denies missed doses - VL <20 11/2022  07/2022: - C/o dry skin and itching bilateral hands. On questioning, hands are in contact with chlorox when hand washing clothes. No other skin involved - Cough is minimal, stable - High BMI. Working with nutritionist and losing weight, making healthier food choices, walking for exercise  01/2023 - On study taking FDC Bictegravir/lenicaprivir, suppression maintained - saw ENT 12/2022, felt could stop SLIT for chronic sinusitis, but doing well and cont'd; she reports symptoms are stable - still working w nutritionist - recovering from URI approx 1 month ago, productive cough - doing well on ART study; denies missed doses. no side effects  - fell at church; had a cast on left wrist; xray showed severe thumb  CMC osteoarthritis; sprain; required 4 stitches at right forehead - HAs can last overnight, bilateral over forehead, throbbing, takes BCs if not resolved with tylenol   07/2023 - ENT - stopped Grastek to monitor symptoms; mainly rhinitis 7/3 - eye surgeries went well - No missed ART doses - nasal drainage mild, improved with nasal spray; minimal cough (confirmed not daily) - not exercising much 2/2 heat, working with nutritionist  Past Medical History:  Diagnosis Date  . Cataracts, bilateral   . Chronic headache   . CKD (chronic kidney disease) stage 3, GFR 30-59 ml/min (CMS-HCC)   . Community acquired pneumonia 02/09/2014   bacteremia with strep pneumo  . Depression    Followed by local therapist, on clonazepam and Celexa   . Eczematous dermatitis 01/10/2011  . Elevated MCV    due to AZT/zidovudine  . History of anemia    resolved, likely due to uncontrolled HIV  . Human immunodeficiency virus (HIV) disease       05/1990= HIV (-), Diagnosed in 1994.  Prior history of poor adherence and dual drug treatment in 1994. CD4 nadir 9. On stable ART since 2008 and undetectable for several years. Currently on Truvada, AZT, Raltegravir and ritonavir-boosted darunavir.   SABRA MVP (mitral valve prolapse)   . Obesity   . Panic attacks   . Problem with literacy   . Seasonal allergies   . 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 Sickle cell trait (CMS-HCC)    Current Outpatient Medications  Medication Sig Dispense Refill  . acetaminophen  (TYLENOL ) 500 MG tablet Take 1 tablet (  500 mg total) by mouth every six (6) hours as needed for pain. 30 tablet 1  . albuterol  HFA 90 mcg/actuation inhaler INHALE TWO (2) PUFFS BY MOUTH EVERY 4-6 HOURS AS NEEDED *NEW PRESCRIPTION REQUEST* 20.1 g 11  . ALLERGY RELIEF, LORATADINE, 10 mg tablet Take 1 tablet (10 mg total) by mouth daily for 365 doses. 90 tablet 3  . atorvastatin (LIPITOR) 10 MG tablet Take 1 tablet (10 mg total) by mouth daily. 90 tablet 3  . azelastine (ASTELIN)  137 mcg (0.1 %) nasal spray 2 sprays into each nostril two (2) times a day. Use in each nostril as directed 2.4 mL 11  . brinzolamide (AZOPT) 1 % ophthalmic suspension Administer 1 drop to both eyes two (2) times a day.    . citalopram (CELEXA) 20 MG tablet Take 1 tablet (20 mg total) by mouth daily.    . EPINEPHrine  (EPIPEN ) 0.3 mg/0.3 mL injection Inject 0.3 mL (0.3 mg total) into the muscle once as needed for anaphylaxis for up to 1 dose. 2 each 1  . ketoconazole (NIZORAL) 2 % cream Apply topically daily. 60 g 3  . LORazepam (ATIVAN) 1 MG tablet Take 1 tablet (1 mg total) by mouth continuous as needed.    . risperiDONE (RISPERDAL) 0.5 MG tablet Take 1 tablet (0.5 mg total) by mouth nightly.    . STUDY GS-US -[id number] BIC/LEN 75/50 mg tablet Take 1 tablet by mouth daily. Take without regard to food. 3 kit 0  . timolol (TIMOPTIC) 0.5 % ophthalmic solution Administer 1 drop to both eyes daily.    SABRA latanoprost (XALATAN) 0.005 % ophthalmic solution Administer 1 drop to both eyes nightly. (Patient not taking: Reported on 08/06/2023) 5 mL 3   No current facility-administered medications for this visit.   Allergies: Grass pollen-june grass standard  Social History: Lives alone in [place]. Prev daughter and grandkids lived with her. On disability. Helps care for her mother, who does not live with her.  Involved at church. No longer w friend. Denies sexually active, 01/2022. Has 3 siblings, who live in [place].  Family History: Family History  Problem Relation Age of Onset  . Glaucoma Mother   . Asthma Mother   . Hypertension Mother   . Cataracts Mother   . Thyroid  disease Sister        graves disease  . Hypertension Brother   . Ovarian cancer Neg Hx   . Endometrial cancer Neg Hx   . Colon cancer Neg Hx   . Breast cancer Neg Hx    Review of Systems: negative except for that in HPI   Objective:   Vitals:   08/06/23 0911  BP: 139/86  BP Site: L Arm  BP Position: Standing  BP Cuff Size:  Medium  Pulse: 82  Temp: 36.5 C (97.7 F)  TempSrc: Temporal  Weight: (!) 103.2 kg (227 lb 9.6 oz)  Height: 160 cm (5' 3)   Repeat BP: improved (but failed to document)  Wt Readings from Last 3 Encounters:  07/12/23 (!) 103.4 kg (228 lb)  05/22/23 (!) 101.7 kg (224 lb 1.6 oz)  05/14/23 (!) 103.1 kg (227 lb 4.7 oz)    Const looks well and attentive, alert, appropriate  Eyes sclerae anicteric, non injected OU  ENT no thrush, leukoplakia or oral lesions and dentition fair  Lymph no cervical, supraclavicular, submental LAD  CV RRR. No murmurs. No rub or gallop. S1/S2.  Lungs CTAB post, normal work of breathing  GI Soft, no organomegaly.  NTND. NABS.  GU deferred  Rectal deferred  Skin No rash on clothed exam  MSK no joint swelling  Neuro Grossly intact cranial nerves  Psych Appropriate affect. Eye contact good. Linear thoughts. Fluent speech.   Laboratory Data Reviewed in Epic today with patient   Assessment/Plan:  58 y.o. long-standing, well-controlled HIV    HIV, well controlled with normal CD4  - Previously on Genvoya+Darunavir, given high level resistance to NRTIs/NNRTIs - prefer no boosting agents given metabolic syndrome, prior drug interaction. - enrolled on Artistry study and taking bictegravir + lenacaprivir, remains suppressed  Allergic rhinitis, stable - Chronic cough, resolved, probable 2/2 environmental allergies - using albuterol  ~every other week 07/2023 - off flonase since 02/2021, but could use as no longer on ART boosting agent with interaction - On claritin - Off Grastek (allergen extract) - Followed by ENT  Mild-intermittent asthma, stable - Followed by Dr. Laury - asthma better controlled with twice monthly SABA - PFTs with mild restriction felt 2/2 obesity  *Iatrogenic Cushing's syndrome 2/2 exogenous glucocorticoid w CYP3A4 inhibitor use  - Low cortisol 02/2021, so stopped flonase, repeat morning cortisol normal off flonase 15.8 on  06/2021; 6 (09/2021)  Obesity - BMI 39 -> 37.8 -> 36.2 -> 38 -> 40 - regained weight since ~08/2022, walking less, encouraged to increase - still working with ID clinic nutritionist, last 06/2023 - unable to get sleep study 2/2 transportation  - encouraged to decrease sugar intake, resume walking when able  Stage I CKD, resolved - creatinine improved to 1.01 - UA 08/2020: no protein  Headaches, chronic, likely tension - regular, taking tylenol  and BCs, less frequent ~every other week, 07/2023,  - Unclear if migraine as bilateral, but throbbing, resolve with above meds - recommended exedrine migraine again  Glaucoma / cataracts - s/p cataract surgery right eye 04/2023, Left eye 05/2023  Depression; anxiety - MH provider provides scripts for celexa, ativan  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   Hx essential hypertension - hydrochlorothiazide stopped 04/2019 2/2 low potassium - lisinopril restarted 04/2019, stopped 10/2019 2/2 persistent cough - bp ok off meds   Hyperlipidemia - Lipid panel 09/2021: TC 206, TG 163, HDL 41, LDL 132 - The 10-year ASCVD risk score (Arnett DK, et al., 2019) is: 9.3% - increase lipitor to 20mg , new script sent and explained to pt, printed directions on AVS  Papillary dermal fibrosis, improved - Scattered hyperpigmented papules w central atrophy on BUE, BLE, lower back; biopsy 04/2021 showed papillary dermal fibrosis - improved with cerave; advised gloves to avoid skin contact with chlorox  Covid, 08/23/20,  resolved  HCM: - CVD: Obese, normotensive, non-smoker  - Cancer Screening:  - Colonoscopy 01/08/14; polyp w/lymphoid tissue - repeat 2025, per GI e-consult - Mammogram normal 06/2015, 07/2017, 07/2018, 08/2019, 08/2020, 09/2021, 11/2022 Immunizations:  - Flu 10/2022 - covid   07/2023 - Td booster 05/2020, Tdap 12/2022 - s/p shingrix x2 - STI: Not sexually active, confirmed 05/2020, 01/2022 - RPR nonreactive 02/2018,  11/2019, 05/2020, 02/2021, 07/2022, 07/2023 pending - TB: PPD negative in 2014 - Hepatitis: Hep A and B immune / HCV Ab Neg (2021)  - HgbA1c 5.3 (08/2018); 5.3 (09/2021), repeating - Ophtho - last seen 06/20/23, treating glaucoma - Dentist - 07/2022, 02/2023  Follow-up: Return in 6 months or sooner if needed.   Next:     Bmi  RSV at age 2    Recent Labs: Absolute CD4 Count  Date Value Ref Range Status  05/14/2023 757 510 - 2,320 /uL Final  02/06/2022 729 510 - 2,320 /uL Final  03/07/2021 572 510 - 2,320 /uL Final  09/06/2020 528 510 - 2,320 /uL Final  11/24/2019 572 510-2,320 /uL Final  02/09/2014 264 (L) 510 - 2,320 /uL Final  11/10/2013 268 (L) 510 - 2,320 /uL Final  06/16/2013 290 (L) 510 - 2,320 /uL Final   CD4% (T Helper)  Date Value Ref Range Status  02/06/2022 27 (L) 34 - 58 % Final  03/07/2021 26 (L) 34 - 58 % Final  09/06/2020 24 (L) 34 - 58 % Final  11/24/2019 26 (L) 34 - 58 % Final  02/09/2014 17 (L) 34 - 58 % Final  11/10/2013 15 (L) 34 - 58 % Final  06/16/2013 15 (L) 34 - 58 % Final  12/16/2012 16 (L) 34 - 58 % Final   HIV RNA Quant Result  Date Value Ref Range Status  05/14/2023 No HIV-1 RNA detected  Final  07/17/2022 Not Detected Not Detected Final  02/06/2022 Not Detected Not Detected Final  10/03/2021 Not Detected Not Detected Final  03/07/2021 Detected (A) Not Detected Final  02/09/2014 Not Detected  Final  11/10/2013 Not Detected  Final  06/16/2013 Not Detected  Final   HIV RNA  Date Value Ref Range Status  05/14/2023 20  Final    Comment:     copies/mL  12/01/2022 <20  Final    Comment:    copies/mL; No HIV-1 RNA detected.  10/04/2022 0  Final    Comment:    copies/mL; No HIV-1 RNA detected.  09/06/2022 0  Final    Comment:    copies/mL; No HIV-1 RNA detected.   HIV RNA Log(10)  Date Value Ref Range Status  03/20/2011 <1.60 LOG COPIES/ML Final   HIV RNA Log10  Date Value Ref Range Status  03/07/2021   Final    Comment:    <1.30 log  07/20/2014 1.87 log copies/mL Final    Lab Results  Component Value Date   WBC 6.2 07/17/2022   WBC 6.0 02/15/2014   Hemoglobin A1C 5.3 10/03/2021   Hemoglobin A1C 5.0 06/16/2013   Platelet 298 07/17/2022   Platelet 278 02/15/2014   Creatinine 1.01 07/17/2022   Creatinine 1.58 (H) 03/02/2014   AST 17 07/17/2022   AST 31 02/12/2014   ALT 21 07/17/2022   ALT 21 02/12/2014   C. Trach Source CERVIX 02/21/2013   GC Source CERVIX 02/21/2013   Triglycerides 163 (H) 10/03/2021   Triglycerides 145 11/20/2011   HDL 38 (L) 12/16/2012   Cholesterol, HDL 41 10/03/2021   LDL Cholesterol, Calculated 94 11/20/2011   Cholesterol, LDL, Calculated 132 (H) 10/03/2021   Immunization History  Administered Date(s) Administered  . COVID-19 VAC,BIVALENT,MODERNA(BLUE CAP) 01/27/2021  . COVID-19 VACC,MRNA,(PFIZER)(PF) 04/16/2019, 05/07/2019, 03/29/2020  . Covid-19 Vac, (80yr+) (Comirnaty) Mrna Pfizer  08/06/2023  . Covid-19 Vac, (30yr+) (Spikevax) Monovalent Moderna 02/06/2022  . INFLUENZA TIV (TRI) PF (IM)(HISTORICAL) 10/30/2006, 09/17/2007, 10/13/2008, 10/05/2009, 12/19/2010, 10/31/2012  . INFLUENZA VACCINE IIV3(IM)(PF)6  MOS UP 12/18/2022  . INFLUENZA VACCINE QUAD (IIV4 PF) 23MO+ INJECTABLE  10/03/2021  . Influenza Vaccine Quad(IM)6 MO-Adult(PF) 11/20/2011, 11/10/2013, 11/17/2014, 09/27/2015, 02/19/2017, 10/01/2017, 11/04/2018, 09/29/2019, 10/03/2021  . Novel Influenza-h1n1-09, All Formulations 02/18/2008  . PNEUMOCOCCAL POLYSACCHARIDE 23-VALENT 08/20/2002, 08/20/2007, 07/02/2019  . PPD Test  08/14/2006, 08/20/2007, 01/12/2009, 04/19/2009, 12/19/2010, 06/24/2012  . Pneumococcal Conjugate 13-Valent 02/26/2012  . SHINGRIX-ZOSTER VACCINE (HZV),RECOMBINANT,ADJUVANTED(IM) 08/26/2018, 03/17/2019  . TD(TDVAX),ADSORBED,2LF(IM)(PF) 05/24/2020  . TdaP 05/10/2006, 12/18/2022   I personally spent 61 minutes face-to-face and non-face-to-face in the care of this patient, which includes all pre, intra, and post visit E/M time on the date of service. All documented time was specific to the E/M visit and does not include any pre, intra, post procedure related time.

## 2023-08-20 NOTE — Progress Notes (Signed)
 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 IOP is high both eyes today.     PLAN:  We are stopping TML, restart Azop and LTP and F/U in 1 month.  We might decide for a more aggressive treatment if IOP still high OU  Patient understands therapeutic goals and shares in the commitment to reach these goals.  Updated prescriptions for glaucoma medications LTP and azopt.          SUBJECTIVE:  IOP is high, but drop use is irregular.     OBJECTIVE:  F/U 1 month with V/T

## 2023-08-26 ENCOUNTER — Other Ambulatory Visit: Payer: Self-pay

## 2023-08-26 ENCOUNTER — Emergency Department

## 2023-08-26 ENCOUNTER — Emergency Department
Admission: EM | Admit: 2023-08-26 | Discharge: 2023-08-26 | Disposition: A | Discharge status: Home / Self Care | Attending: Emergency Medicine | Admitting: Emergency Medicine

## 2023-08-26 DIAGNOSIS — R0602 Shortness of breath: Secondary | ICD-10-CM | POA: Diagnosis present

## 2023-08-26 DIAGNOSIS — F419 Anxiety disorder, unspecified: Secondary | ICD-10-CM | POA: Insufficient documentation

## 2023-08-26 DIAGNOSIS — D649 Anemia, unspecified: Secondary | ICD-10-CM | POA: Diagnosis not present

## 2023-08-26 DIAGNOSIS — R079 Chest pain, unspecified: Secondary | ICD-10-CM

## 2023-08-26 HISTORY — DX: Unspecified asthma, uncomplicated: J45.909

## 2023-08-26 LAB — CBC
HCT: 36.8 % (ref 36.0–46.0)
Hemoglobin: 11.7 g/dL — ABNORMAL LOW (ref 12.0–15.0)
MCH: 29.5 pg (ref 26.0–34.0)
MCHC: 31.8 g/dL (ref 30.0–36.0)
MCV: 92.9 fL (ref 80.0–100.0)
Platelets: 280 K/uL (ref 150–400)
RBC: 3.96 MIL/uL (ref 3.87–5.11)
RDW: 12.9 % (ref 11.5–15.5)
WBC: 8.7 K/uL (ref 4.0–10.5)
nRBC: 0 % (ref 0.0–0.2)

## 2023-08-26 LAB — BASIC METABOLIC PANEL WITH GFR
Anion gap: 10 (ref 5–15)
BUN: 15 mg/dL (ref 6–20)
CO2: 23 mmol/L (ref 22–32)
Calcium: 8.6 mg/dL — ABNORMAL LOW (ref 8.9–10.3)
Chloride: 108 mmol/L (ref 98–111)
Creatinine, Ser: 1.21 mg/dL — ABNORMAL HIGH (ref 0.44–1.00)
GFR, Estimated: 52 mL/min — ABNORMAL LOW (ref >60)
Glucose, Bld: 85 mg/dL (ref 70–99)
Potassium: 3.7 mmol/L (ref 3.5–5.1)
Sodium: 141 mmol/L (ref 135–145)

## 2023-08-26 LAB — TROPONIN I (HIGH SENSITIVITY): Troponin I (High Sensitivity): 7 ng/L (ref <18)

## 2023-08-26 NOTE — ED Triage Notes (Signed)
 Pt to ED fpr SOB that started today at church. States church service was intense and was crying because has been going through a lot and felt anxious and overwhelmed. Denies SI.Hx asthma. Did inhalers today. Respirations are unlabored.

## 2023-08-26 NOTE — ED Provider Notes (Signed)
 [facility] Provider Note    Event Date/Time   First MD Initiated Contact with Patient 08/26/23 1500     (approximate)   History   Shortness of Breath   HPI  Caitlin Mcbride is a 58 y.o. female past medical history significant for anxiety, HIV on ART, who presents to the emergency department with an episode of shortness of breath.  States that she was at church today when she started feel very short of breath.  States that she got very emotional and started crying and was feeling very anxious because she has a lot going on at home.  States that she went home and had an Ativan which significantly improved her pain.  She then came to the emergency department for further evaluation.  Denies any shortness of breath or chest pain at this time.  Denies prior stress testing or cardiac catheterization.  Endorses compliance with her antiretroviral therapy and is followed closely as an outpatient with infectious disease.  Denies any history of DVT or PE.  Denies any pleuritic chest pain.  No recent travel.  No OCPs.  No recent surgery.     Physical Exam   Triage Vital Signs: ED Triage Vitals  Encounter Vitals Group     BP 08/26/23 1305 122/72     Girls Systolic BP Percentile --      Girls Diastolic BP Percentile --      Boys Systolic BP Percentile --      Boys Diastolic BP Percentile --      Pulse Rate 08/26/23 1305 88     Resp 08/26/23 1305 18     Temp 08/26/23 1305 98.3 F (36.8 C)     Temp Source 08/26/23 1305 Oral     SpO2 08/26/23 1305 97 %     Weight 08/26/23 1303 204 lb (92.5 kg)     Height 08/26/23 1303 5' 3 (1.6 m)     Head Circumference --      Peak Flow --      Pain Score 08/26/23 1302 0     Pain Loc --      Pain Education --      Exclude from Growth Chart --     Most recent vital signs: Vitals:   08/26/23 1305  BP: 122/72  Pulse: 88  Resp: 18  Temp: 98.3 F (36.8 C)  SpO2: 97%    Physical Exam Constitutional:      Appearance:  She is well-developed.  HENT:     Head: Atraumatic.  Eyes:     Conjunctiva/sclera: Conjunctivae normal.  Cardiovascular:     Rate and Rhythm: Regular rhythm.  Pulmonary:     Effort: No respiratory distress.  Abdominal:     General: There is no distension.  Musculoskeletal:        General: Normal range of motion.     Cervical back: Normal range of motion.     Right lower leg: No edema.     Left lower leg: No edema.     Comments: No unilateral leg swelling  Skin:    General: Skin is warm.  Neurological:     Mental Status: She is alert. Mental status is at baseline.     IMPRESSION / MDM / ASSESSMENT AND PLAN / ED COURSE  I reviewed the triage vital signs and the nursing notes.  Differential diagnosis including anxiety/panic disorder, ACS, anemia, electrolyte abnormality, pneumonia, pneumothorax  Low suspicion for pulmonary embolism, no pleuritic chest pain,  no active shortness of breath at this time  EKG  I, Clotilda Punter, the attending physician, personally viewed and interpreted this ECG.   Rate: Normal  Rhythm: Normal sinus  Axis: Right  Intervals: Normal  ST&T Change: None  No tachycardic or bradycardic dysrhythmias while on cardiac telemetry.  RADIOLOGY I independently reviewed imaging, my interpretation of imaging: Chest x-ray no signs of pneumonia, no acute finding  LABS (all labs ordered are listed, but only abnormal results are displayed) Labs interpreted as -    Labs Reviewed  BASIC METABOLIC PANEL WITH GFR - Abnormal; Notable for the following components:      Result Value   Creatinine, Ser 1.21 (*)    Calcium 8.6 (*)    GFR, Estimated 52 (*)    All other components within normal limits  CBC - Abnormal; Notable for the following components:   Hemoglobin 11.7 (*)    All other components within normal limits  TROPONIN I (HIGH SENSITIVITY)     MDM  Creatinine at baseline.  No significant leukocytosis.  Anemia with a hemoglobin of 11.7 which  looks decreased when compared to her baseline.  Denies any signs or symptoms of a GI bleed and last hemoglobin was in 2022.  Troponin is negative at 7.  No chest pain at this time and no shortness of breath.  Have a low suspicion for pulmonary embolism or ACS.  EKG without findings of ischemia.  Most likely stress/panic attack.  Discussed close follow-up as an outpatient with primary care.  Discussed follow-up with cardiology for possible stress testing and return to the emergency department if she had any return of symptoms.  No questions at time of discharge.     PROCEDURES:  Critical Care performed: No  Procedures  Patient's presentation is most consistent with acute presentation with potential threat to life or bodily function.   MEDICATIONS ORDERED IN ED: Medications - No data to display  FINAL CLINICAL IMPRESSION(S) / ED DIAGNOSES   Final diagnoses:  Shortness of breath  Anxiety  Chest pain, unspecified type     Rx / DC Orders   ED Discharge Orders          Ordered    Ambulatory referral to Cardiology       Comments: If you have not heard from the Cardiology office within the next 72 hours please call [phone].   08/26/23 1601             Note:  This document was prepared using Dragon voice recognition software and may include unintentional dictation errors.   Punter Clotilda, MD 08/26/23 2029

## 2023-08-26 NOTE — Discharge Instructions (Signed)
 You were seen in the emergency department for an episode of shortness of breath.  Concerned that it could be from a panic attack while you were at church.  It is importantly follow-up closely with your primary care physician.  You have multiple risk factors for heart attack so if your symptoms return it is important that you return immediately to the emergency department for reevaluation.  Talk to your primary care physician about outpatient follow-up with cardiology for possible stress testing.

## 2023-08-26 NOTE — ED Triage Notes (Signed)
 First nurse note: Arrived by [facility] from home. Started feeling sob while at church. EMS reports able to speak in complete sentences  EMS vitals: 99% RA 85HR 118/78 b/p

## 2023-09-25 ENCOUNTER — Encounter: Payer: Self-pay | Admitting: Cardiology

## 2023-09-25 ENCOUNTER — Ambulatory Visit: Attending: Cardiology | Admitting: Cardiology

## 2023-09-25 VITALS — BP 130/78 | HR 66 | Ht 63.0 in | Wt 223.2 lb

## 2023-09-25 DIAGNOSIS — R0602 Shortness of breath: Secondary | ICD-10-CM | POA: Diagnosis not present

## 2023-09-25 DIAGNOSIS — Z6839 Body mass index (BMI) 39.0-39.9, adult: Secondary | ICD-10-CM | POA: Diagnosis not present

## 2023-09-25 DIAGNOSIS — E7800 Pure hypercholesterolemia, unspecified: Secondary | ICD-10-CM | POA: Diagnosis not present

## 2023-09-25 DIAGNOSIS — R072 Precordial pain: Secondary | ICD-10-CM

## 2023-09-25 MED ORDER — METOPROLOL TARTRATE 100 MG PO TABS: ORAL_TABLET | ORAL | 0 refills | Status: AC

## 2023-09-25 NOTE — Patient Instructions (Signed)
 Medication Instructions:  - take 100 mg of metoprolol  two hours prior to cardiac CT *If you need a refill on your cardiac medications before your next appointment, please call your pharmacy*  Lab Work: Your provider would like for you to have following labs drawn today BMP.   If you have labs (blood work) drawn today and your tests are completely normal, you will receive your results only by: MyChart Message (if you have MyChart) OR A paper copy in the mail If you have any lab test that is abnormal or we need to change your treatment, we will call you to review the results.  Testing/Procedures: Your physician has requested that you have an echocardiogram. Echocardiography is a painless test that uses sound waves to create images of your heart. It provides your doctor with information about the size and shape of your heart and how well your heart's chambers and valves are working.   You may receive an ultrasound enhancing agent through an IV if needed to better visualize your heart during the echo. This procedure takes approximately one hour.  There are no restrictions for this procedure.  This will take place at [address] [facility] [address]) [address], [place] [address]  Please note: We ask at that you not bring children with you during ultrasound (echo/ vascular) testing. Due to room size and safety concerns, children are not allowed in the ultrasound rooms during exams. Our front office staff cannot provide observation of children in our lobby area while testing is being conducted. An adult accompanying a patient to their appointment will only be allowed in the ultrasound room at the discretion of the ultrasound technician under special circumstances. We apologize for any inconvenience.   Your cardiac CT will be scheduled at:  [facility] [address] [place], [place] [address] [phone]  Please arrive 15 mins early for check-in and test  prep.  There is spacious parking and easy access to the radiology department from the [facility] Heart and Vascular entrance. Please enter here and check-in with the desk attendant.    Please follow these instructions carefully (unless otherwise directed):  An IV will be required for this test and Nitroglycerin will be given.  Hold all erectile dysfunction medications at least 3 days (72 hrs) prior to test. (Ie viagra, cialis, sildenafil, tadalafil, etc)     On the Night Before the Test: Be sure to Drink plenty of water. Do not consume any caffeinated/decaffeinated beverages or chocolate 12 hours prior to your test. Do not take any antihistamines 12 hours prior to your test.  On the Day of the Test: Drink plenty of water until 1 hour prior to the test. Do not eat any food 1 hour prior to test. You may take your regular medications prior to the test.  Take metoprolol  (Lopressor ) two hours prior to test. If you take Furosemide/Hydrochlorothiazide/Spironolactone/Chlorthalidone, please HOLD on the morning of the test. Patients who wear a continuous glucose monitor MUST remove the device prior to scanning. FEMALES- please wear underwire-free bra if available, avoid dresses & tight clothing       After the Test: Drink plenty of water. After receiving IV contrast, you may experience a mild flushed feeling. This is normal. On occasion, you may experience a mild rash up to 24 hours after the test. This is not dangerous. If this occurs, you can take Benadryl  25 mg, Zyrtec, Claritin, or Allegra and increase your fluid intake. (Patients taking Tikosyn should avoid Benadryl ,  and may take Zyrtec, Claritin, or Allegra) If you experience trouble breathing, this can be serious. If it is severe call 911 IMMEDIATELY. If it is mild, please call our office.  We will call to schedule your test 2-4 weeks out understanding that some insurance companies will need an authorization prior to the service being  performed.   For more information and frequently asked questions, please visit our website : [email or web address]  For non-scheduling related questions, please contact the cardiac imaging nurse navigator should you have any questions/concerns: Cardiac Imaging Nurse Navigators Direct Office Dial: [phone]   For scheduling needs, including cancellations and rescheduling, please call [place], [phone].    Follow-Up: At [facility], you and your health needs are our priority.  As part of our continuing mission to provide you with exceptional heart care, our providers are all part of one team.  This team includes your primary Cardiologist (physician) and Advanced Practice Providers or APPs (Physician Assistants and Nurse Practitioners) who all work together to provide you with the care you need, when you need it.  Your next appointment:   3 month(s)  Provider:   You may see Dr. Darliss or one of the following Advanced Practice Providers on your designated Care Team:   Lonni Meager, NP Lesley Maffucci, PA-C Bernardino Bring, PA-C Cadence Hindman, PA-C Tylene Lunch, NP Barnie Hila, NP    We recommend signing up for the patient portal called MyChart.  Sign up information is provided on this After Visit Summary.  MyChart is used to connect with patients for Virtual Visits (Telemedicine).  Patients are able to view lab/test results, encounter notes, upcoming appointments, etc.  Non-urgent messages can be sent to your provider as well.   To learn more about what you can do with MyChart, go to [email or web address].

## 2023-09-25 NOTE — Progress Notes (Signed)
 Cardiology Office Note:    Date:  09/25/2023   ID:  [name], [name] [birth year], MRN [id number]  PCP:  Patient, No Pcp Per   [facility] Providers Cardiologist:  None     Referring MD: Suzanne Kirsch, MD   Chief Complaint  Patient presents with   Establish Care    New pt has been doing well with no complaints of chest pain, chest pressure or SOB, medciation reviewed verbally with patient, patient states she has anxiety     History of Present Illness:    Caitlin Mcbride is a 58 y.o. female with a hx of hyperlipidemia, obesity, asthma, anxiety, HIV on ART presenting with chest pain and shortness of breath.  Complains of symptoms of shortness of breath and chest discomfort ongoing over 6 months now.  Presented to the ED last month due to chest pain and shortness of breath.  Episodes began while at home sitting, states having a lot on her mind, gets anxious easily and sometimes grandkids cause her to be stressed.  Endorses rapid heartbeats with symptom recurrence.  Workup in the ED with troponins and EKG was unrevealing.  Denies personal history of heart attacks.  Endorses using crack cocaine in the past for about 10 years.  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  Past Medical History:  Diagnosis Date   Asthma    Depression    HIV (human immunodeficiency virus infection) (HCC)    Reflux     Past Surgical History:  Procedure Laterality Date   ABDOMINAL HYSTERECTOMY      Current Medications: Current Meds  Medication Sig   albuterol  (VENTOLIN  HFA) 108 (90 Base) MCG/ACT inhaler Inhale 2 puffs into the lungs every 6 (six) hours as needed for wheezing or shortness of breath.   atorvastatin (LIPITOR) 20 MG tablet Take 20 mg by mouth daily.   brompheniramine-pseudoephedrine-DM 30-2-10 MG/5ML syrup Take 10 mLs by mouth 4 (four) times daily as needed.   LORazepam (ATIVAN) 1 MG tablet Take 1 mg by mouth every 8 (eight) hours as needed for anxiety.    metoprolol  tartrate (LOPRESSOR ) 100 MG tablet TAKE 1 TABLET 2 HR PRIOR TO CARDIAC PROCEDURE   ondansetron  (ZOFRAN ) 4 MG tablet Take 1-2 tabs by mouth every 8 hours as needed for nausea/vomiting   predniSONE  (DELTASONE ) 50 MG tablet Take 1 tablet (50 mg total) by mouth daily with breakfast.   PROAIR  HFA 108 (90 Base) MCG/ACT inhaler Inhale 2 puffs into the lungs every 6 (six) hours as needed.     Allergies:   Gramineae pollens   Social History   Socioeconomic History   Marital status: Legally Separated    Spouse name: Not on file   Number of children: Not on file   Years of education: Not on file   Highest education level: Not on file  Occupational History   Not on file  Tobacco Use   Smoking status: Never   Smokeless tobacco: Never  Vaping Use   Vaping status: Never Used  Substance and Sexual Activity   Alcohol use: No   Drug use: No   Sexual activity: Yes  Other Topics Concern   Not on file  Social History Narrative   Not on file   Social Drivers of Health   Financial Resource Strain: Not on file  Food Insecurity: No Food Insecurity (08/06/2023)   Received from [facility]   Hunger Vital Sign    Within the past 12 months,  you worried that your food would run out before you got the money to buy more.: Never true    Within the past 12 months, the food you bought just didn't last and you didn't have money to get more.: Never true  Transportation Needs: No Transportation Needs (08/06/2023)   Received from [facility]    Lack of Transportation (Medical): No    Lack of Transportation (Non-Medical): No  Physical Activity: Not on file  Stress: Not on file  Social Connections: Not on file     Family History: The patient's family history includes Hypertension in her mother.  ROS:   Please see the history of present illness.     All other systems reviewed and are negative.  EKGs/Labs/Other Studies Reviewed:    The following studies  were reviewed today:  EKG Interpretation Date/Time:  Tuesday September 25 2023 10:27:32 EDT Ventricular Rate:  66 PR Interval:  134 QRS Duration:  78 QT Interval:  386 QTC Calculation: 404 R Axis:   50  Text Interpretation: Normal sinus rhythm Normal ECG Confirmed by Darliss Rogue ([id number]) on 09/25/2023 10:41:46 AM    Recent Labs: 08/26/2023: BUN 15; Creatinine, Ser 1.21; Hemoglobin 11.7; Platelets 280; Potassium 3.7; Sodium 141  Recent Lipid Panel No results found for: CHOL, TRIG, HDL, CHOLHDL, VLDL, LDLCALC, LDLDIRECT  Outside lipid panel 08/08/2023 total cholesterol 142, triglycerides 99, LDL 81, HDL 48  Risk Assessment/Calculations:             Physical Exam:    VS:  BP 130/78 (BP Location: Left Arm, Patient Position: Sitting)   Pulse 66   Ht 5' 3 (1.6 m)   Wt 223 lb 3.2 oz (101.2 kg)   SpO2 97%   BMI 39.54 kg/m     Wt Readings from Last 3 Encounters:  09/25/23 223 lb 3.2 oz (101.2 kg)  08/26/23 204 lb (92.5 kg)  12/31/22 207 lb (93.9 kg)     GEN:  Well nourished, well developed in no acute distress HEENT: Normal NECK: No JVD; No carotid bruits CARDIAC: RRR, no murmurs, rubs, gallops RESPIRATORY:  Clear to auscultation without rales, wheezing or rhonchi  ABDOMEN: Soft, non-tender, non-distended MUSCULOSKELETAL:  No edema; No deformity  SKIN: Warm and dry NEUROLOGIC:  Alert and oriented x 3 PSYCHIATRIC:  Normal affect   ASSESSMENT:    1. Precordial pain   2. SOB (shortness of breath)   3. Pure hypercholesterolemia   4. BMI 39.0-39.9,adult    PLAN:    In order of problems listed above:  Chest pain, several risk factors.  Obtain echo, obtain coronary CTA.  Stress and anxiety could also be contributing.  Reassured patient if cardiac testing is unrevealing. Shortness of breath, cardiac workup as above.  Obesity likely etiology. Hyperlipidemia, cholesterol controlled, continue Lipitor 20 mg daily. Obesity, low-calorie diet, increase  activity, weight loss advised.  Follow up after cardiac testing     Medication Adjustments/Labs and Tests Ordered: Current medicines are reviewed at length with the patient today.  Concerns regarding medicines are outlined above.  Orders Placed This Encounter  Procedures   CT CORONARY MORPH W/CTA COR W/SCORE W/CA W/CM &/OR WO/CM   Basic metabolic panel with GFR   EKG 87-Ozji   ECHOCARDIOGRAM COMPLETE   Meds ordered this encounter  Medications   metoprolol  tartrate (LOPRESSOR ) 100 MG tablet    Sig: TAKE 1 TABLET 2 HR PRIOR TO CARDIAC PROCEDURE    Dispense:  1 tablet  Refill:  0    Patient Instructions  Medication Instructions:  - take 100 mg of metoprolol  two hours prior to cardiac CT *If you need a refill on your cardiac medications before your next appointment, please call your pharmacy*  Lab Work: Your provider would like for you to have following labs drawn today BMP.   If you have labs (blood work) drawn today and your tests are completely normal, you will receive your results only by: MyChart Message (if you have MyChart) OR A paper copy in the mail If you have any lab test that is abnormal or we need to change your treatment, we will call you to review the results.  Testing/Procedures: Your physician has requested that you have an echocardiogram. Echocardiography is a painless test that uses sound waves to create images of your heart. It provides your doctor with information about the size and shape of your heart and how well your heart's chambers and valves are working.   You may receive an ultrasound enhancing agent through an IV if needed to better visualize your heart during the echo. This procedure takes approximately one hour.  There are no restrictions for this procedure.  This will take place at [address] [facility] [address]) [address], [place] [address]  Please note: We ask at that you not bring children with you during ultrasound (echo/ vascular)  testing. Due to room size and safety concerns, children are not allowed in the ultrasound rooms during exams. Our front office staff cannot provide observation of children in our lobby area while testing is being conducted. An adult accompanying a patient to their appointment will only be allowed in the ultrasound room at the discretion of the ultrasound technician under special circumstances. We apologize for any inconvenience.   Your cardiac CT will be scheduled at:  [facility] [address] [place], [place] [address] [phone]  Please arrive 15 mins early for check-in and test prep.  There is spacious parking and easy access to the radiology department from the [facility] Heart and Vascular entrance. Please enter here and check-in with the desk attendant.    Please follow these instructions carefully (unless otherwise directed):  An IV will be required for this test and Nitroglycerin will be given.  Hold all erectile dysfunction medications at least 3 days (72 hrs) prior to test. (Ie viagra, cialis, sildenafil, tadalafil, etc)     On the Night Before the Test: Be sure to Drink plenty of water. Do not consume any caffeinated/decaffeinated beverages or chocolate 12 hours prior to your test. Do not take any antihistamines 12 hours prior to your test.  On the Day of the Test: Drink plenty of water until 1 hour prior to the test. Do not eat any food 1 hour prior to test. You may take your regular medications prior to the test.  Take metoprolol  (Lopressor ) two hours prior to test. If you take Furosemide/Hydrochlorothiazide/Spironolactone/Chlorthalidone, please HOLD on the morning of the test. Patients who wear a continuous glucose monitor MUST remove the device prior to scanning. FEMALES- please wear underwire-free bra if available, avoid dresses & tight clothing       After the Test: Drink plenty of water. After receiving IV contrast, you may experience a  mild flushed feeling. This is normal. On occasion, you may experience a mild rash up to 24 hours after the test. This is not dangerous. If this occurs, you can take Benadryl  25 mg, Zyrtec, Claritin, or Allegra and  increase your fluid intake. (Patients taking Tikosyn should avoid Benadryl , and may take Zyrtec, Claritin, or Allegra) If you experience trouble breathing, this can be serious. If it is severe call 911 IMMEDIATELY. If it is mild, please call our office.  We will call to schedule your test 2-4 weeks out understanding that some insurance companies will need an authorization prior to the service being performed.   For more information and frequently asked questions, please visit our website : [email or web address]  For non-scheduling related questions, please contact the cardiac imaging nurse navigator should you have any questions/concerns: Cardiac Imaging Nurse Navigators Direct Office Dial: [phone]   For scheduling needs, including cancellations and rescheduling, please call [place], [phone].    Follow-Up: At [facility], you and your health needs are our priority.  As part of our continuing mission to provide you with exceptional heart care, our providers are all part of one team.  This team includes your primary Cardiologist (physician) and Advanced Practice Providers or APPs (Physician Assistants and Nurse Practitioners) who all work together to provide you with the care you need, when you need it.  Your next appointment:   3 month(s)  Provider:   You may see Dr. Darliss or one of the following Advanced Practice Providers on your designated Care Team:   Lonni Meager, NP Lesley Maffucci, PA-C Bernardino Bring, PA-C Cadence McClusky, PA-C Tylene Lunch, NP Barnie Hila, NP    We recommend signing up for the patient portal called MyChart.  Sign up information is provided on this After Visit Summary.  MyChart is used to connect with patients  for Virtual Visits (Telemedicine).  Patients are able to view lab/test results, encounter notes, upcoming appointments, etc.  Non-urgent messages can be sent to your provider as well.   To learn more about what you can do with MyChart, go to [email or web address].            Signed, Redell Darliss, MD  09/25/2023 12:33 PM    [facility]

## 2023-09-26 LAB — BASIC METABOLIC PANEL WITH GFR
BUN/Creatinine Ratio: 8 — ABNORMAL LOW (ref 9–23)
BUN: 9 mg/dL (ref 6–24)
CO2: 24 mmol/L (ref 20–29)
Calcium: 9.1 mg/dL (ref 8.7–10.2)
Chloride: 105 mmol/L (ref 96–106)
Creatinine, Ser: 1.08 mg/dL — ABNORMAL HIGH (ref 0.57–1.00)
Glucose: 75 mg/dL (ref 70–99)
Potassium: 4.4 mmol/L (ref 3.5–5.2)
Sodium: 143 mmol/L (ref 134–144)
eGFR: 59 mL/min/1.73 — ABNORMAL LOW (ref >59)

## 2023-10-04 ENCOUNTER — Ambulatory Visit: Admitting: Cardiology

## 2023-10-16 ENCOUNTER — Encounter (HOSPITAL_COMMUNITY): Payer: Self-pay

## 2023-10-18 ENCOUNTER — Ambulatory Visit
Admission: RE | Admit: 2023-10-18 | Discharge: 2023-10-18 | Disposition: A | Discharge status: Home / Self Care | Source: Ambulatory Visit | Attending: Cardiology | Admitting: Cardiology

## 2023-10-18 ENCOUNTER — Ambulatory Visit
Admission: RE | Admit: 2023-10-18 | Discharge: 2023-10-18 | Disposition: A | Discharge status: Home / Self Care | Source: Ambulatory Visit

## 2023-10-18 ENCOUNTER — Other Ambulatory Visit: Payer: Self-pay

## 2023-10-18 ENCOUNTER — Encounter: Payer: Self-pay | Admitting: Radiology

## 2023-10-18 DIAGNOSIS — R931 Abnormal findings on diagnostic imaging of heart and coronary circulation: Secondary | ICD-10-CM

## 2023-10-18 DIAGNOSIS — I2510 Atherosclerotic heart disease of native coronary artery without angina pectoris: Secondary | ICD-10-CM

## 2023-10-18 DIAGNOSIS — R072 Precordial pain: Secondary | ICD-10-CM | POA: Diagnosis present

## 2023-10-18 MED ORDER — IOHEXOL 350 MG/ML SOLN
100.0000 mL | Freq: Once | INTRAVENOUS | Status: AC | PRN
  Administered 2023-10-18: 100 mL via INTRAVENOUS

## 2023-10-18 MED ORDER — NITROGLYCERIN 0.4 MG SL SUBL
0.8000 mg | SUBLINGUAL_TABLET | Freq: Once | SUBLINGUAL | Status: AC
  Administered 2023-10-18: 0.8 mg via SUBLINGUAL
  Filled 2023-10-18: qty 25

## 2023-10-18 NOTE — Progress Notes (Signed)
 Patient tolerated CT well. Vital signs stable encourage to drink water throughout day.Reasons explained and verbalized understanding. Ambulated steady gait.

## 2023-10-22 ENCOUNTER — Ambulatory Visit: Payer: Self-pay | Admitting: Cardiology

## 2023-10-23 MED ORDER — ASPIRIN 81 MG PO TBEC: 81.0000 mg | DELAYED_RELEASE_TABLET | Freq: Every day | ORAL | Status: AC

## 2023-10-23 NOTE — Telephone Encounter (Signed)
 Patient says she is returning call regarding results and starting on Aspirin.

## 2023-10-25 ENCOUNTER — Telehealth: Payer: Self-pay | Admitting: Cardiology

## 2023-10-25 NOTE — Telephone Encounter (Signed)
 Pt c/o medication issue:  1. Name of Medication:   aspirin EC 81 MG tablet   2. How are you currently taking this medication (dosage and times per day)?   3. Are you having a reaction (difficulty breathing--STAT)?   4. What is your medication issue?    Patient wants a call back to discuss this medication.

## 2023-10-25 NOTE — Telephone Encounter (Signed)
 Left a message for the patient to call back.

## 2023-11-15 ENCOUNTER — Other Ambulatory Visit

## 2023-12-26 ENCOUNTER — Ambulatory Visit: Attending: Cardiology

## 2023-12-31 ENCOUNTER — Ambulatory Visit: Admitting: Cardiology

## 2024-01-28 ENCOUNTER — Ambulatory Visit

## 2024-02-05 ENCOUNTER — Ambulatory Visit: Admitting: Cardiology

## 2024-02-07 ENCOUNTER — Ambulatory Visit

## 2024-03-18 ENCOUNTER — Ambulatory Visit
